# Patient Record
Sex: Female | Born: 1935 | Race: White | Hispanic: No | Marital: Married | Smoking: Never smoker
Health system: Southern US, Community
[De-identification: ages and names within clinical notes are randomized; demographics above are authoritative.]

## PROBLEM LIST (undated history)

## (undated) DIAGNOSIS — I480 Paroxysmal atrial fibrillation: Secondary | ICD-10-CM

## (undated) DIAGNOSIS — J449 Chronic obstructive pulmonary disease, unspecified: Secondary | ICD-10-CM

## (undated) DIAGNOSIS — I509 Heart failure, unspecified: Secondary | ICD-10-CM

## (undated) DIAGNOSIS — R55 Syncope and collapse: Secondary | ICD-10-CM

## (undated) DIAGNOSIS — I639 Cerebral infarction, unspecified: Secondary | ICD-10-CM

## (undated) DIAGNOSIS — G20 Parkinson's disease: Secondary | ICD-10-CM

## (undated) DIAGNOSIS — G20A1 Parkinson's disease without dyskinesia, without mention of fluctuations: Secondary | ICD-10-CM

## (undated) HISTORY — DX: Parkinson's disease: G20

## (undated) HISTORY — DX: Paroxysmal atrial fibrillation: I48.0

## (undated) HISTORY — DX: Parkinson's disease without dyskinesia, without mention of fluctuations: G20.A1

## (undated) HISTORY — PX: COLON SURGERY: SHX602

## (undated) HISTORY — PX: OOPHORECTOMY: SHX86

## (undated) HISTORY — DX: Cerebral infarction, unspecified: I63.9

## (undated) HISTORY — DX: Syncope and collapse: R55

## (undated) HISTORY — PX: CHOLECYSTECTOMY: SHX55

---

## 2007-07-14 ENCOUNTER — Encounter: Payer: Self-pay | Admitting: Cardiology

## 2009-08-08 ENCOUNTER — Encounter: Payer: Self-pay | Admitting: Cardiology

## 2009-08-29 ENCOUNTER — Encounter: Payer: Self-pay | Admitting: Cardiology

## 2009-10-07 ENCOUNTER — Ambulatory Visit: Payer: Self-pay | Admitting: Cardiology

## 2009-10-07 DIAGNOSIS — I4892 Unspecified atrial flutter: Secondary | ICD-10-CM | POA: Insufficient documentation

## 2009-10-07 DIAGNOSIS — I4891 Unspecified atrial fibrillation: Secondary | ICD-10-CM

## 2009-10-07 DIAGNOSIS — I6350 Cerebral infarction due to unspecified occlusion or stenosis of unspecified cerebral artery: Secondary | ICD-10-CM | POA: Insufficient documentation

## 2009-10-10 ENCOUNTER — Encounter (INDEPENDENT_AMBULATORY_CARE_PROVIDER_SITE_OTHER): Payer: Self-pay | Admitting: *Deleted

## 2009-10-14 ENCOUNTER — Ambulatory Visit: Payer: Self-pay | Admitting: Cardiology

## 2009-10-14 LAB — CONVERTED CEMR LAB: POC INR: 2.4

## 2009-10-16 ENCOUNTER — Encounter: Payer: Self-pay | Admitting: Cardiology

## 2009-10-20 ENCOUNTER — Encounter (INDEPENDENT_AMBULATORY_CARE_PROVIDER_SITE_OTHER): Payer: Self-pay | Admitting: *Deleted

## 2009-10-21 ENCOUNTER — Ambulatory Visit: Payer: Self-pay | Admitting: Cardiology

## 2009-10-28 ENCOUNTER — Ambulatory Visit: Payer: Self-pay | Admitting: Cardiology

## 2009-10-28 ENCOUNTER — Encounter: Payer: Self-pay | Admitting: Cardiology

## 2009-10-28 LAB — CONVERTED CEMR LAB: POC INR: 4

## 2009-11-01 ENCOUNTER — Ambulatory Visit: Payer: Self-pay | Admitting: Cardiology

## 2009-11-01 LAB — CONVERTED CEMR LAB: POC INR: 2.1

## 2009-11-29 ENCOUNTER — Ambulatory Visit: Payer: Self-pay | Admitting: Cardiology

## 2009-12-14 ENCOUNTER — Ambulatory Visit: Payer: Self-pay | Admitting: Cardiology

## 2009-12-14 ENCOUNTER — Encounter: Payer: Self-pay | Admitting: Cardiology

## 2009-12-14 ENCOUNTER — Encounter: Payer: Self-pay | Admitting: Physician Assistant

## 2009-12-14 ENCOUNTER — Encounter (INDEPENDENT_AMBULATORY_CARE_PROVIDER_SITE_OTHER): Payer: Self-pay | Admitting: *Deleted

## 2009-12-16 ENCOUNTER — Encounter: Payer: Self-pay | Admitting: Cardiology

## 2009-12-22 ENCOUNTER — Encounter: Payer: Self-pay | Admitting: Physician Assistant

## 2009-12-22 ENCOUNTER — Ambulatory Visit: Payer: Self-pay | Admitting: Cardiology

## 2009-12-27 ENCOUNTER — Encounter: Payer: Self-pay | Admitting: Cardiology

## 2009-12-27 ENCOUNTER — Ambulatory Visit: Payer: Self-pay | Admitting: Cardiology

## 2009-12-28 ENCOUNTER — Encounter (INDEPENDENT_AMBULATORY_CARE_PROVIDER_SITE_OTHER): Payer: Self-pay | Admitting: *Deleted

## 2010-05-20 ENCOUNTER — Encounter: Payer: Self-pay | Admitting: Cardiology

## 2010-05-21 ENCOUNTER — Encounter: Payer: Self-pay | Admitting: Physician Assistant

## 2010-05-24 ENCOUNTER — Encounter: Payer: Self-pay | Admitting: Physician Assistant

## 2010-05-25 ENCOUNTER — Encounter: Payer: Self-pay | Admitting: Physician Assistant

## 2010-06-20 ENCOUNTER — Ambulatory Visit: Admit: 2010-06-20 | Payer: Self-pay | Admitting: Physician Assistant

## 2010-06-20 NOTE — Assessment & Plan Note (Signed)
Summary: Beverely Pace ADD THIS DATE  Nurse Visit  CC: ekg only   Allergies: 1)  ! Codeine  Orders Added: 1)  EKG w/ Interpretation [93000] EKG relatively benign with sinus rhythm and first degree AV block otherwise normal tracing.

## 2010-06-20 NOTE — Letter (Signed)
Summary: Engineer, materials at [facility]  [address]   [place], [place] [address]   Phone: [phone]  Fax: [phone]        October 20, 2009 MRN: [id number]   Kaylee Drake [address] [place], [place]  [address]   Dear [name],  Your test ordered by Selena Batten has been reviewed by your physician (or physician assistant) and was found to be normal or stable. Your physician (or physician assistant) felt no changes were needed at this time.  __X__ Echocardiogram  ____ Cardiac Stress Test  ____ Lab Work  ____ Peripheral vascular study of arms, legs or neck  ____ CT scan or X-ray  ____ Lung or Breathing test  ____ Other:   Thank you.   Hoover Brunette, LPN    Duane Boston, M.D., F.A.C.C. Thressa Sheller, M.D., F.A.C.C. Oneal Grout, M.D., F.A.C.C. Cheree Ditto, M.D., F.A.C.C. Daiva Nakayama, M.D., F.A.C.C. Kenney Houseman, M.D., F.A.C.C. Jeanne Ivan, PA-C

## 2010-06-20 NOTE — Medication Information (Signed)
Summary: ccr-lr  Anticoagulant Therapy  Managed by: Vashti Hey, RN PCP: Carney Bern Rutherford Nail) Supervising MD: Andee Lineman MD, Michelle Piper Indication 1: Atrial Fibrillation Lab Used: [facility] [place] Site: [place] INR POC 1.9  Dietary changes: no    Health status changes: no    Bleeding/hemorrhagic complications: no    Recent/future hospitalizations: no    Any changes in medication regimen? no    Recent/future dental: no  Any missed doses?: no       Is patient compliant with meds? yes       Allergies: 1)  ! Codeine  Anticoagulation Management History:      The patient is taking warfarin and comes in today for a routine follow up visit.  Positive risk factors for bleeding include an age of 70 years or older and history of CVA/TIA.  The bleeding index is 'intermediate risk'.  Positive CHADS2 values include Age > 73 years old and Prior Stroke/CVA/TIA.  Anticoagulation responsible provider: Andee Lineman MD, Michelle Piper.  INR POC: 1.9.    Anticoagulation Management Assessment/Plan:      The patient's current anticoagulation dose is Coumadin 5 mg tabs: take as directed per coumadin clinic, Coumadin 5 mg tabs: take as directed per coumadin clini.  The target INR is 2.0-3.0.  The next INR is due 12/20/2009.  Anticoagulation instructions were given to patient.  Results were reviewed/authorized by Vashti Hey, RN.  She was notified by Vashti Hey RN.         Prior Anticoagulation Instructions: INR 2.1 Continue coumadin 5mg  once daily except 7.5mg  on Wednesdays Will have labs drawn week of 6/27 in [place]  She will return 11/21/09.   Next INR  11/29/09  Current Anticoagulation Instructions: INR 1.9 Increase coumadin to 5mg  once daily except 7.5mg  once daily except M,W,F

## 2010-06-20 NOTE — Procedures (Signed)
Summary: Holter and Event/ [facility]  Holter and Event/ [facility]   Imported By: Dorise Hiss 10/10/2009 13:59:47  _____________________________________________________________________  External Attachment:    Type:   Image     Comment:   External Document

## 2010-06-20 NOTE — Letter (Signed)
Summary: Graded Exercise Tolerance Test  [facility] [address]   [place], [place] [address]   Phone: [phone]  Fax: [phone]      [facility]  Graded Exercise Tolerance Test    Tawana Scale  Appointment Date:_  Appointment Time:_   Your doctor has ordered a stress test to help determine the condition of your  heart during exercise. If you take blood pressure medicine , ask your doctor if you should take it the day of your test. You may eat a light meal before your test.  Please be sure to bring the copy of your order with you.   You should dress comfortably, for example: Sweat pants, shorts, or skirt, Loose                                                                                                       short sleeved T-shirt, Rubber soled lace-up shoes (tennis shoes)  You will need to arrive 15 minutes before your appointment time. You will also need to enter at the Main Entrance of the hospital and go to the registration desk. They will direct you to the Cardiovascular Department on the third floor.  You will need to plan on being at the hospital for one hour from registration for this appointment.

## 2010-06-20 NOTE — Medication Information (Signed)
Summary: CCN- START 5/20  Anticoagulant Therapy  Managed by: Vashti Hey, RN PCP: Carney Bern Rutherford Nail) Supervising MD: Andee Lineman MD, Michelle Piper Indication 1: Atrial Fibrillation Lab Used: [facility] [place] Site: [place] INR POC 2.4  Dietary changes: no    Health status changes: yes       Details: break through atrial fib  Bleeding/hemorrhagic complications: no    Recent/future hospitalizations: no    Any changes in medication regimen? yes       Details: Started on coumadin 5mg  qd on 10/07/09  Recent/future dental: no  Any missed doses?: no       Is patient compliant with meds? yes       Allergies: 1)  ! Codeine  Anticoagulation Management History:      The patient is taking warfarin and comes in today for a routine follow up visit.  Positive risk factors for bleeding include an age of 9 years or older and history of CVA/TIA.  The bleeding index is 'intermediate risk'.  Positive CHADS2 values include Age > 83 years old and Prior Stroke/CVA/TIA.  Anticoagulation responsible provider: Andee Lineman MD, Michelle Piper.  INR POC: 2.4.    Anticoagulation Management Assessment/Plan:      The patient's current anticoagulation dose is Coumadin 5 mg tabs: take as directed per coumadin clinic, Coumadin 5 mg tabs: take as directed per coumadin clini.  The target INR is 2.0-3.0.  The next INR is due 10/21/2009.  Anticoagulation instructions were given to patient.  Results were reviewed/authorized by Vashti Hey, RN.  She was notified by Vashti Hey RN.        Coagulation management information includes: 10/14/09 Check INR's till theraputic then transfer to PMD.  Current Anticoagulation Instructions: INR 2.4 Continue coumadin 5mg  once daily

## 2010-06-20 NOTE — Medication Information (Signed)
Summary: ccr-lr  Anticoagulant Therapy  Managed by: Vashti Hey, RN PCP: Carney Bern Rutherford Nail) Supervising MD: Diona Browner MD, Remi Deter Indication 1: Atrial Fibrillation Lab Used: [facility] [place] Site: [place] INR POC 4.0  Dietary changes: no    Health status changes: no    Bleeding/hemorrhagic complications: no    Recent/future hospitalizations: no    Any changes in medication regimen? no    Recent/future dental: no  Any missed doses?: no       Is patient compliant with meds? yes       Allergies: 1)  ! Codeine  Anticoagulation Management History:      The patient is taking warfarin and comes in today for a routine follow up visit.  Positive risk factors for bleeding include an age of 75 years or older and history of CVA/TIA.  The bleeding index is 'intermediate risk'.  Positive CHADS2 values include Age > 75 years old and Prior Stroke/CVA/TIA.  Anticoagulation responsible provider: Diona Browner MD, Remi Deter.  INR POC: 4.0.  Cuvette Lot#: [id number].    Anticoagulation Management Assessment/Plan:      The patient's current anticoagulation dose is Coumadin 5 mg tabs: take as directed per coumadin clinic, Coumadin 5 mg tabs: take as directed per coumadin clini.  The target INR is 2.0-3.0.  The next INR is due 11/01/2009.  Anticoagulation instructions were given to patient.  Results were reviewed/authorized by Vashti Hey, RN.  She was notified by Vashti Hey RN.         Prior Anticoagulation Instructions: INR 1.6 Take coumadin 10mg  tonight, 7.5mg  tomorrow night then increase dose to 5mg  once daily except 7.5mg  on Wednesdays  Current Anticoagulation Instructions: INR 4.0 Hold coumadin tonight then resume 5mg  once daily except 7.5mg  on Wednesdays

## 2010-06-20 NOTE — Letter (Signed)
Summary: Internal Other/ PATIENT HISTORY FORM  Internal Other/ PATIENT HISTORY FORM   Imported By: Dorise Hiss 10/10/2009 13:57:26  _____________________________________________________________________  External Attachment:    Type:   Image     Comment:   External Document

## 2010-06-20 NOTE — Medication Information (Signed)
Summary: ccr-lr  Anticoagulant Therapy  Managed by: Vashti Hey, RN PCP: Carney Bern Rutherford Nail) Supervising MD: Andee Lineman MD, Michelle Piper Indication 1: Atrial Fibrillation Lab Used: [facility] [place] Site: [place] INR POC 1.6  Dietary changes: no    Health status changes: no    Bleeding/hemorrhagic complications: no    Recent/future hospitalizations: no    Any changes in medication regimen? no    Recent/future dental: no  Any missed doses?: yes     Details: missed 1 dose Wed night  Is patient compliant with meds? yes       Allergies: 1)  ! Codeine  Anticoagulation Management History:      The patient is taking warfarin and comes in today for a routine follow up visit.  Positive risk factors for bleeding include an age of 75 years or older and history of CVA/TIA.  The bleeding index is 'intermediate risk'.  Positive CHADS2 values include Age > 75 years old and Prior Stroke/CVA/TIA.  Anticoagulation responsible provider: Andee Lineman MD, Michelle Piper.  INR POC: 1.6.  Cuvette Lot#: [id number].    Anticoagulation Management Assessment/Plan:      The patient's current anticoagulation dose is Coumadin 5 mg tabs: take as directed per coumadin clinic, Coumadin 5 mg tabs: take as directed per coumadin clini.  The target INR is 2.0-3.0.  The next INR is due 10/28/2009.  Anticoagulation instructions were given to patient.  Results were reviewed/authorized by Vashti Hey, RN.  She was notified by Vashti Hey RN.         Prior Anticoagulation Instructions: INR 2.4 Continue coumadin 5mg  once daily   Current Anticoagulation Instructions: INR 1.6 Take coumadin 10mg  tonight, 7.5mg  tomorrow night then increase dose to 5mg  once daily except 7.5mg  on Wednesdays

## 2010-06-20 NOTE — Assessment & Plan Note (Signed)
Summary: NP-PALPS  REQUEST DEGENT   Visit Type:  Initial Consult Primary Provider:  Carney Bern Rutherford Nail)   History of Present Illness: the patient is a 77 year old female being evaluated for history of palpitations and chest tightness.  The patient reports prior history of afibrillation and several years ago was started on propafenone.  Interestingly however she is only on once a day dosing.  She had a prior cardiac catheterization several years ago which was within normal limits.  She does have significant risk factors however in a setting of intrafibrillation for stroke including female sex age 9 and a prior history of stroke.  Most recently she was seen in the emergency room in [place] because of a prolonged episode of intrafibrillation which lasted an hour and a half.  She stapes episodes are unpredictable and can occur at various times.typically however it lasted 20 to 25 minutes.  Her most recent episode was clearly much longer.  This was associated with some dizziness and shortness of breath but no definite chest pain.  Of note is also that the patient is only on Aggrenox but is not taking Coumadin.  He also had no assessment of her recent ejection fraction.  Current Medications (verified): 1)  Alprazolam 1 Mg Tabs (Alprazolam) .... Take 1 Tablet By Mouth Two Times A Day 2)  Vitamin B-12 2500 Mcg Subl (Cyanocobalamin) .... Take 1 Tablet By Mouth Once A Day 3)  Centrum Silver  Tabs (Multiple Vitamins-Minerals) .... Take 1 Tablet By Mouth Once A Day 4)  Vitamin C 1000 Mg Tabs (Ascorbic Acid) .... Take 1 Tablet By Mouth Once A Day 5)  Calcium-Magnesium-Zinc 333-133-8.3 Mg Tabs (Calcium-Magnesium-Zinc) .... Take 1 Tablet By Mouth Once A Day 6)  Lexapro 20 Mg Tabs (Escitalopram Oxalate) .... Take 1 Tablet By Mouth Once A Day 7)  Propafenone Hcl 150 Mg Tabs (Propafenone Hcl) .... Take One Tab Every 8 Hours 8)  Diltiazem Hcl Er Beads 240 Mg Xr24h-Cap (Diltiazem Hcl Er Beads) ....  Take 1 Tablet By Mouth Once A Day 9)  Omeprazole 20 Mg Cpdr (Omeprazole) .... Take 1 Tablet By Mouth Two Times A Day 10)  Azilect 1 Mg Tabs (Rasagiline Mesylate) .... Take 1 Tablet By Mouth Once A Day 11)  Cetirizine Hcl 10 Mg Tabs (Cetirizine Hcl) .... Take 1 Tablet By Mouth Once A Day 12)  Eql Coq10 Max St 400 Mg Caps (Coenzyme Q10) .... Take 1 Tablet By Mouth Once A Day 13)  Folic Acid 1 Mg Tabs (Folic Acid) .... Take 1 Tablet By Mouth Once A Day 14)  Black Cohosh 540 Mg Caps (Black Cohosh) .... Take 1 Tablet By Mouth Once A Day 15)  Estroven  Tabs (Nutritional Supplements) .... Take 1 Tablet By Mouth Once A Day 16)  Lidoderm 5 % Ptch (Lidocaine) .... Apply Patch On For  12 Hours and Off 12 Hours 17)  Pravastatin Sodium 20 Mg Tabs (Pravastatin Sodium) .... Take 1 Tablet By Mouth Once A Day 18)  Trimethoprim 100 Mg Tabs (Trimethoprim) .... Take 1 Tablet By Mouth Once A Day 19)  Coumadin 5 Mg Tabs (Warfarin Sodium) .... Take As Directed Per Coumadin Clinic 20)  Coumadin 5 Mg Tabs (Warfarin Sodium) .... Take As Directed Per Coumadin Clini  Allergies (verified): 1)  ! Codeine  Comments:  Nurse/Medical Assistant: The patient's medications and allergies were reviewed with the patient and were updated in the Medication and Allergy Lists. List reviewed.  Past History:  Past Medical History: history  of atrialfibrillation history of stroke history of colon surgery as well as gallbladder surgery and oophorectomy.  Family History: mother died from a stroke.  Father died in a hit and run accident.  Social History: the patient is retired and married.  She used to do the data entry.  Chest the children are in good health.  She does not smoke.  Review of Systems       The patient complains of palpitations.  The patient denies fatigue, malaise, fever, weight gain/loss, vision loss, decreased hearing, hoarseness, chest pain, shortness of breath, prolonged cough, wheezing, sleep apnea, coughing  up blood, abdominal pain, blood in stool, nausea, vomiting, diarrhea, heartburn, incontinence, blood in urine, muscle weakness, joint pain, leg swelling, rash, skin lesions, headache, fainting, dizziness, depression, anxiety, enlarged lymph nodes, easy bruising or bleeding, and environmental allergies.    Vital Signs:  Patient profile:   77 year old female Height:      64 inches Weight:      162 pounds BMI:     27.91 Pulse rate:   75 / minute BP sitting:   111 / 69  (left arm) Cuff size:   regular  Vitals Entered By: Carlye Grippe (Oct 07, 2009 9:13 AM)  Nutrition Counseling: Patient's BMI is greater than 25 and therefore counseled on weight management options.  Physical Exam  Additional Exam:  General: Well-developed, well-nourished in no distress head: Normocephalic and atraumatic eyes PERRLA/EOMI intact, conjunctiva and lids normal nose: No deformity or lesions mouth normal dentition, normal posterior pharynx neck: Supple, no JVD.  No masses, thyromegaly or abnormal cervical nodes lungs: Normal breath sounds bilaterally without wheezing.  Normal percussion heart: regular rate and rhythm with normal S1 and S2, no S3 or S4.  PMI is normal.  No pathological murmurs abdomen: Normal bowel sounds, abdomen is soft and nontender without masses, organomegaly or hernias noted.  No hepatosplenomegaly musculoskeletal: Back normal, normal gait muscle strength and tone normal pulsus: Pulse is normal in all 4 extremities Extremities: No peripheral pitting edema neurologic: Alert and oriented x 3 skin: Intact without lesions or rashes cervical nodes: No significant adenopathy psychologic: Normal affect    Impression & Recommendations:  Problem # 1:  ATRIAL FIBRILLATION (ICD-427.31) the patient had breakthrough atrial fibrillation.  She is on adequate dosing with propafenone.  This will be increased to hundred 50 mg p.o. q.8 hours.  The patient has no known coronary artery disease.  We  will obtain a 2-D echocardiogram however to [place] reassess her ejection fraction The following medications were removed from the medication list:    Aggrenox 25-200 Mg Xr12h-cap (Aspirin-dipyridamole) .Marland Kitchen... Take 1 tablet by mouth two times a day Her updated medication list for this problem includes:    Propafenone Hcl 150 Mg Tabs (Propafenone hcl) .Marland Kitchen... Take one tab every 8 hours    Coumadin 5 Mg Tabs (Warfarin sodium) .Marland Kitchen... Take as directed per coumadin clinic    Coumadin 5 Mg Tabs (Warfarin sodium) .Marland Kitchen... Take as directed per coumadin clini  Orders: T-Basic Metabolic Panel [phone]) T-TSH [phone]) 2-D Echocardiogram (2D Echo)  Problem # 2:  COUMADIN THERAPY (ICD-V58.61) the patient is at high risk for thromboembolic disease.  She will be started on Coumadin.  We will initially enroll her in the Coumadin clinic until she is therapeutic and then she can follow-up with her primary care physician for PT and evaluation for monthly basis.  Problem # 3:  CVA (ICD-434.91) no residual.  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 The following medications were removed from the medication list:    Aggrenox 25-200 Mg Xr12h-cap (Aspirin-dipyridamole) .Marland Kitchen... Take 1 tablet by mouth two times a day Her updated medication list for this problem includes:    Coumadin 5 Mg Tabs (Warfarin sodium) .Marland Kitchen... Take as directed per coumadin clinic    Coumadin 5 Mg Tabs (Warfarin sodium) .Marland Kitchen... Take as directed per coumadin clini  Other Orders: EKG w/ Interpretation (93000)  Patient Instructions: 1)  Begin Coumadin 5mg  every evening 2)  Enroll in coumadin clinic - will check here till therapeutic, then will transfer to PMD for management 3)  Stop Aggrenox 4)  Increase Propafenone to 150mg  every 8 hours 5)  Follow up in  1 month Prescriptions: PROPAFENONE HCL 150 MG TABS (PROPAFENONE HCL) take one tab every 8 hours  #270 x 3   Entered by:   Hoover Brunette, LPN   Authorized by:   Lewayne Bunting, MD, [facility]   Signed by:   Hoover Brunette, LPN on 54/01/8118   Method used:   Faxed to ...       [facility])             , [place]         Ph: [phone]       Fax: [phone]   RxID:   [id number] COUMADIN 5 MG TABS (WARFARIN SODIUM) take as directed per coumadin clini  #30 x 2   Entered by:   Hoover Brunette, LPN   Authorized by:   Lewayne Bunting, MD, [facility]   Signed by:   Hoover Brunette, LPN on 72/53/6644   Method used:   Print then Give to Patient   RxID:   [id number]   Handout requested. COUMADIN 5 MG TABS (WARFARIN SODIUM) take as directed per coumadin clinic  #30 x 2   Entered by:   Hoover Brunette, LPN   Authorized by:   Lewayne Bunting, MD, [facility]   Signed by:   Hoover Brunette, LPN on 33/29/5188   Method used:   Print then Give to Patient   RxID:   [id number]   Handout requested.

## 2010-06-20 NOTE — Medication Information (Signed)
Summary: ccr-lr  Anticoagulant Therapy  Managed by: Vashti Hey, RN PCP: Carney Bern Rutherford Nail) Supervising MD: Antoine Poche MD, Fayrene Fearing Indication 1: Atrial Fibrillation Lab Used: [facility] [place] Site: [place] INR POC 1.7  Dietary changes: no    Health status changes: no    Bleeding/hemorrhagic complications: no    Recent/future hospitalizations: no    Any changes in medication regimen? no    Recent/future dental: no  Any missed doses?: no       Is patient compliant with meds? yes       Allergies: 1)  ! Codeine  Anticoagulation Management History:      The patient is taking warfarin and comes in today for a routine follow up visit.  Positive risk factors for bleeding include an age of 5 years or older and history of CVA/TIA.  The bleeding index is 'intermediate risk'.  Positive CHADS2 values include Age > 47 years old and Prior Stroke/CVA/TIA.  Her last INR was 2.6.  Anticoagulation responsible provider: Antoine Poche MD, Fayrene Fearing.  INR POC: 1.7.  Cuvette Lot#: [id number].    Anticoagulation Management Assessment/Plan:      The patient's current anticoagulation dose is Coumadin 5 mg tabs: take as directed per coumadin clinic.  The target INR is 2.0-3.0.  The next INR is due 12/30/2009.  Anticoagulation instructions were given to patient.  Results were reviewed/authorized by Vashti Hey, RN.  She was notified by Vashti Hey RN.         Prior Anticoagulation Instructions: INR 2.6 Continue coumadin 5mg  once daily except 7.5mg  on M,W,F [place] recheck INR on 12/30/09 then refer to PMD for management Pt told  Current Anticoagulation Instructions: INR 1.7 Pt missed dose last night so she took it early this morning (7.5mg ) She will resume her current dose of 5mg  once daily except 7.5mg  on Mondays, Wednesdays and Fridays Recheck INR in 10 - 14 days in [place]

## 2010-06-20 NOTE — Procedures (Signed)
Summary: Holter and Event  Holter and Event   Imported By: Claudette Laws 08/26/2009 08:18:10  _____________________________________________________________________  External Attachment:    Type:   Image     Comment:   External Document

## 2010-06-20 NOTE — Medication Information (Signed)
Summary: ccr-lr  Anticoagulant Therapy  Managed by: Vashti Hey, RN PCP: Carney Bern Rutherford Nail) Supervising MD: Andee Lineman MD, Michelle Piper Indication 1: Atrial Fibrillation Lab Used: [facility] [place] Site: [place] INR POC 2.1  Dietary changes: no    Health status changes: no    Bleeding/hemorrhagic complications: no    Recent/future hospitalizations: no    Any changes in medication regimen? no    Recent/future dental: no  Any missed doses?: no       Is patient compliant with meds? yes       Allergies: 1)  ! Codeine  Anticoagulation Management History:      The patient is taking warfarin and comes in today for a routine follow up visit.  Positive risk factors for bleeding include an age of 48 years or older and history of CVA/TIA.  The bleeding index is 'intermediate risk'.  Positive CHADS2 values include Age > 82 years old and Prior Stroke/CVA/TIA.  Anticoagulation responsible provider: Andee Lineman MD, Michelle Piper.  INR POC: 2.1.  Cuvette Lot#: [id number].    Anticoagulation Management Assessment/Plan:      The patient's current anticoagulation dose is Coumadin 5 mg tabs: take as directed per coumadin clinic, Coumadin 5 mg tabs: take as directed per coumadin clini.  The target INR is 2.0-3.0.  The next INR is due 11/29/2009.  Anticoagulation instructions were given to patient.  Results were reviewed/authorized by Vashti Hey, RN.  She was notified by Vashti Hey RN.         Prior Anticoagulation Instructions: INR 4.0 Hold coumadin tonight then resume 5mg  once daily except 7.5mg  on Wednesdays  Current Anticoagulation Instructions: INR 2.1 Continue coumadin 5mg  once daily except 7.5mg  on Wednesdays Will have labs drawn week of 6/27 in [place]  She will return 11/21/09.   Next INR  11/29/09

## 2010-06-20 NOTE — Medication Information (Signed)
Summary: Coumadin Clinic  Anticoagulant Therapy  Managed by: Vashti Hey, RN PCP: Carney Bern [facility] Nail) Supervising MD: Diona Browner MD, Remi Deter Indication 1: Atrial Fibrillation Lab Used: [facility] [place] Site: [place] INR POC 2.6  Dietary changes: no    Health status changes: no    Bleeding/hemorrhagic complications: no    Recent/future hospitalizations: no    Any changes in medication regimen? no    Recent/future dental: no  Any missed doses?: no       Is patient compliant with meds? yes       Allergies: 1)  ! Codeine  Anticoagulation Management History:      The patient is taking warfarin and comes in today for a routine follow up visit.  Positive risk factors for bleeding include an age of 75 years or older and history of CVA/TIA.  The bleeding index is 'intermediate risk'.  Positive CHADS2 values include Age > 3 years old and Prior Stroke/CVA/TIA.  Her last INR was 2.6.  Anticoagulation responsible provider: Diona Browner MD, Remi Deter.  INR POC: 2.6.  Cuvette Lot#: [id number].    Anticoagulation Management Assessment/Plan:      The patient's current anticoagulation dose is Coumadin 5 mg tabs: take as directed per coumadin clinic.  The target INR is 2.0-3.0.  The next INR is due 12/30/2009.  Anticoagulation instructions were given to patient.  Results were reviewed/authorized by Vashti Hey, RN.  She was notified by Vashti Hey RN.         Prior Anticoagulation Instructions: INR 1.9 Increase coumadin to 5mg  once daily except 7.5mg  once daily except M,W,F  Current Anticoagulation Instructions: INR 2.6 Continue coumadin 5mg  once daily except 7.5mg  on M,W,F Wiil recheck INR on 12/30/09 then refer to PMD for management Pt told

## 2010-06-20 NOTE — Letter (Signed)
Summary: Engineer, materials at [facility]  [address]   [place], [place] [address]   Phone: [phone]  Fax: [phone]        December 28, 2009 MRN: [id number]   Kaylee Drake [address] [place], [place]  [address]   Dear Kaylee Drake,  Your test ordered by Selena Batten has been reviewed by your physician (or physician assistant) and was found to be normal or stable. Your physician (or physician assistant) felt no changes were needed at this time.  ____ Echocardiogram  __X__ Cardiac Stress Test  __X_ Lab Work  ____ Peripheral vascular study of arms, legs or neck  ____ CT scan or X-ray  ____ Lung or Breathing test  ____ Other:   Thank you.   Hoover Brunette, LPN    Duane Boston, M.D., F.A.C.C. Thressa Sheller, M.D., F.A.C.C. Oneal Grout, M.D., F.A.C.C. Cheree Ditto, M.D., F.A.C.C. Daiva Nakayama, M.D., F.A.C.C. Kenney Houseman, M.D., F.A.C.C. Jeanne Ivan, PA-C

## 2010-06-20 NOTE — Medication Information (Signed)
Summary: RX Folder/ PATIENT ORDER FORM  RX Folder/ PATIENT ORDER FORM   Imported By: Dorise Hiss 10/28/2009 15:11:52  _____________________________________________________________________  External Attachment:    Type:   Image     Comment:   External Document

## 2010-06-20 NOTE — Letter (Signed)
Summary: Engineer, materials at [facility]  [address]   [place], [place] [address]   Phone: [phone]  Fax: [phone]        Oct 10, 2009 MRN: [id number]   EMILYGRACE GROTHE [address] [place], [place]  [address]   Dear [name],  Your test ordered by Selena Batten has been reviewed by your physician (or physician assistant) and was found to be normal or stable. Your physician (or physician assistant) felt no changes were needed at this time.  ____ Echocardiogram  ____ Cardiac Stress Test  __X__ Lab Work  ____ Peripheral vascular study of arms, legs or neck  ____ CT scan or X-ray  ____ Lung or Breathing test  ____ Other:   Thank you.   Hoover Brunette, LPN    Duane Boston, M.D., F.A.C.C. Thressa Sheller, M.D., F.A.C.C. Oneal Grout, M.D., F.A.C.C. Cheree Ditto, M.D., F.A.C.C. Daiva Nakayama, M.D., F.A.C.C. Kenney Houseman, M.D., F.A.C.C. Jeanne Ivan, PA-C

## 2010-06-20 NOTE — Medication Information (Signed)
Summary: RX Folder/ PROPAFENONE HCL APPROVED ESI  RX Folder/ PROPAFENONE HCL APPROVED ESI   Imported By: Dorise Hiss 10/20/2009 14:00:17  _____________________________________________________________________  External Attachment:    Type:   Image     Comment:   External Document

## 2010-06-20 NOTE — Assessment & Plan Note (Signed)
Summary: 1 MO FU   Visit Type:  Follow-up Primary Provider:  Carney Bern [facility])   History of Present Illness: patient presents for scheduled followup.   When last seen, she was placed on an increased dose of propafenone for more aggressive treatment of frequent, breakthrough PAF. She reports today that she has not had any recurrent palpitations. A 2-D echo was also ordered, for assessment of LVF:  EF 60-65%, with normal wall motion, and with no significant valvular abnormalities.  She denies any interim development of exertional angina pectoris or significant dyspnea. Of note, she does suggest gait instability, but was diagnosed with Parkinson's disease approximately 18 months ago. She is now on Coumadin anticoagulation, initiated at time of last visit here, per Dr. Andee Lineman, and is currently being monitored in our clinic.  Anticoagulation Management History:      Today's INR is 2.6.    Preventive Screening-Counseling & Management  Alcohol-Tobacco     Smoking Status: never  Current Medications (verified): 1)  Alprazolam 1 Mg Tabs (Alprazolam) .... Take 1 Tablet By Mouth Two Times A Day 2)  Vitamin B-12 2500 Mcg Subl (Cyanocobalamin) .... Take 1 Tablet By Mouth Once A Day 3)  Centrum Silver  Tabs (Multiple Vitamins-Minerals) .... Take 1 Tablet By Mouth Once A Day 4)  Vitamin C 1000 Mg Tabs (Ascorbic Acid) .... Take 1 Tablet By Mouth Once A Day 5)  Calcium-Magnesium-Zinc 333-133-8.3 Mg Tabs (Calcium-Magnesium-Zinc) .... Take 1 Tablet By Mouth Once A Day 6)  Lexapro 20 Mg Tabs (Escitalopram Oxalate) .... Take 1 Tablet By Mouth Once A Day 7)  Propafenone Hcl 150 Mg Tabs (Propafenone Hcl) .... Take One Tab Every 8 Hours 8)  Diltiazem Hcl Er Beads 240 Mg Xr24h-Cap (Diltiazem Hcl Er Beads) .... Take 1 Tablet By Mouth Once A Day 9)  Omeprazole 20 Mg Cpdr (Omeprazole) .... Take 1 Tablet By Mouth Two Times A Day 10)  Azilect 1 Mg Tabs (Rasagiline Mesylate) .... Take 1 Tablet By Mouth Once  A Day 11)  Cetirizine Hcl 10 Mg Tabs (Cetirizine Hcl) .... Take 1 Tablet By Mouth Once A Day 12)  Eql Coq10 Max St 400 Mg Caps (Coenzyme Q10) .... Take 1 Tablet By Mouth Once A Day 13)  Folic Acid 1 Mg Tabs (Folic Acid) .... Take 1 Tablet By Mouth Once A Day 14)  Black Cohosh 540 Mg Caps (Black Cohosh) .... Take 1 Tablet By Mouth Once A Day 15)  Estroven  Tabs (Nutritional Supplements) .... Take 1 Tablet By Mouth Once A Day 16)  Lidoderm 5 % Ptch (Lidocaine) .... Apply Patch On For  12 Hours and Off 12 Hours 17)  Pravastatin Sodium 20 Mg Tabs (Pravastatin Sodium) .... Take 1 Tablet By Mouth Once A Day 18)  Trimethoprim 100 Mg Tabs (Trimethoprim) .... Take 1 Tablet By Mouth Once A Day 19)  Coumadin 5 Mg Tabs (Warfarin Sodium) .... Take As Directed Per Coumadin Clinic  Allergies (verified): 1)  ! Codeine  Comments:  Nurse/Medical Assistant: The patient's medication list and allergies were reviewed with the patient and were updated in the Medication and Allergy Lists.  Past History:  Past Medical History: Paroxysmal atrial fibrillation Stroke Parkinson's disease  Past Surgical History: Colon surgery Cholecystectomy Oophorectomy  Social History: Smoking Status:  never  Review of Systems       No fevers, chills, hemoptysis, dysphagia, melena, hematocheezia, hematuria, rash, claudication, orthopnea, pnd, pedal edema. Gait instability with history of falls, but none since last office visit.  No syncope. All other systems negative.   Vital Signs:  Patient profile:   77 year old female Height:      64 inches Weight:      163 pounds Pulse rate:   75 / minute BP sitting:   108 / 68  (left arm) Cuff size:   regular  Vitals Entered By: Carlye Grippe (December 14, 2009 2:11 PM)  Physical Exam  Additional Exam:  GEN:77 year old female, sitting upright, no distress HEENT: NCAT,PERRLA,EOMI NECK: palpable pulses, no bruits; no JVD; no TM LUNGS: CTA bilaterally HEART: RRR (S1S2); no  significant murmurs; no rubs; no gallops ABD: soft, NT; intact BS EXT: intact distal pulses; no edema SKIN: warm, dry MUSC: no obvious deformity NEURO: A/O (x3)     EKG  Procedure date:  12/14/2009  Findings:      first degree AV block at 72 bpm; new RBBB; QT interval of 0.4 seconds, per our calculation (512 ms, per computer)  Impression & Recommendations:  Problem # 1:  ATRIAL FIBRILLATION (ICD-427.31)  maintaining normal sinus rhythm/first-degree AV block on increased dose of propafenone. However, she has developed new RBBB. QT Interval also appears more prolonged than on previous studies. Therefore, we'll decrease her propafenone to b.i.d. dosing, and schedule a routine treadmill test next week, to rule out proarrhythmia effect. We'll also check propafenone drug level. Return clinic visit with Dr. Andee Lineman in 4 months. Of note, with respect to her recent 2-D echo result (normal LVEF), patient asked about the previously noted "hole in heart". No such condition was noted on current study. We'll request echocardiogram result from her previous cardiologist, Dr. Gasper Sells, in [place], for review.  Problem # 2:  COUMADIN THERAPY (ICD-V58.61)  recommend subsequent monitoring and management, per primary care team in [place]. INR 2.6 in our clinic today.  Problem # 3:  CVA (ICD-434.91) Assessment: Comment Only  Other Orders: EKG w/ Interpretation (93[address]) Protime INR ([address]) One Laboratory Results   Blood Tests   Date/Time Recieved: December 14, 2009 2:14 PM  Date/Time Reported: December 14, 2009 2:14 PM    INR: 2.6   (Normal Range: 0.88-1.12   Therap INR: 2.0-3.5)       Anticoagulant Therapy  Managed by: Vashti Hey, RN PCP: Carney Bern Rutherford Nail) Supervising MD: Andee Lineman MD, Michelle Piper Indication 1: Atrial Fibrillation Lab Used: [facility] [place] Site: [place] INR POC 2.6  Vital Signs: Weight: 163 lbs.  Pulse Rate: 75  Blood Pressure:  108 / 68     Dietary changes: no    Health status changes: no    Bleeding/hemorrhagic complications: no    Recent/future hospitalizations: no    Any changes in medication regimen? no    Recent/future dental: no  Any missed doses?: no       Is patient compliant with meds? yes       Appended Document: [facility]     Current Medications (verified): 1)  Alprazolam 1 Mg Tabs (Alprazolam) .... Take 1 Tablet By Mouth Two Times A Day 2)  Vitamin B-12 2500 Mcg Subl (Cyanocobalamin) .... Take 1 Tablet By Mouth Once A Day 3)  Centrum Silver  Tabs (Multiple Vitamins-Minerals) .... Take 1 Tablet By Mouth Once A Day 4)  Vitamin C 1000 Mg Tabs (Ascorbic Acid) .... Take 1 Tablet By Mouth Once A Day 5)  Calcium-Magnesium-Zinc 333-133-8.3 Mg Tabs (Calcium-Magnesium-Zinc) .... Take 1 Tablet By Mouth Once A Day 6)  Lexapro 20 Mg Tabs (Escitalopram Oxalate) .... Take  1 Tablet By Mouth Once A Day 7)  Propafenone Hcl 150 Mg Tabs (Propafenone Hcl) .... Take 1 Tablet By Mouth Two Times A Day 8)  Diltiazem Hcl Er Beads 240 Mg Xr24h-Cap (Diltiazem Hcl Er Beads) .... Take 1 Tablet By Mouth Once A Day 9)  Omeprazole 20 Mg Cpdr (Omeprazole) .... Take 1 Tablet By Mouth Two Times A Day 10)  Azilect 1 Mg Tabs (Rasagiline Mesylate) .... Take 1 Tablet By Mouth Once A Day 11)  Cetirizine Hcl 10 Mg Tabs (Cetirizine Hcl) .... Take 1 Tablet By Mouth Once A Day 12)  Eql Coq10 Max St 400 Mg Caps (Coenzyme Q10) .... Take 1 Tablet By Mouth Once A Day 13)  Folic Acid 1 Mg Tabs (Folic Acid) .... Take 1 Tablet By Mouth Once A Day 14)  Black Cohosh 540 Mg Caps (Black Cohosh) .... Take 1 Tablet By Mouth Once A Day 15)  Estroven  Tabs (Nutritional Supplements) .... Take 1 Tablet By Mouth Once A Day 16)  Lidoderm 5 % Ptch (Lidocaine) .... Apply Patch On For  12 Hours and Off 12 Hours 17)  Pravastatin Sodium 20 Mg Tabs (Pravastatin Sodium) .... Take 1 Tablet By Mouth Once A Day 18)  Trimethoprim 100 Mg Tabs (Trimethoprim) ....  Take 1 Tablet By Mouth Once A Day 19)  Coumadin 5 Mg Tabs (Warfarin Sodium) .... Take As Directed Per Coumadin Clinic  Allergies: 1)  ! Codeine   Other Orders: T- * Misc. Laboratory test [phone]) GXT (GXT)  Patient Instructions: 1)  Decrease Propafenone to 150mg  two times a day  2)  Routine Treadmill 3)  Lab:   Propafenone level 4)  Follow up coumadin with primary MD 5)  Will request recent Echo result from Dr. Farris Has  6)  Follow up in  4 months

## 2010-06-20 NOTE — Medication Information (Signed)
Summary: Coumadin Clinic  Anticoagulant Therapy  Managed by: Inactive PCP: Carney Bern Rutherford Nail) Supervising MD: Antoine Poche MD, Fayrene Fearing Indication 1: Atrial Fibrillation Lab Used: [facility] [place] Site: [place]          Comments: Pt is transfering coumadin management to [employer] [facility] in [place]  Allergies: 1)  ! Codeine  Anticoagulation Management History:      Positive risk factors for bleeding include an age of 75 years or older and history of CVA/TIA.  The bleeding index is 'intermediate risk'.  Positive CHADS2 values include Age > 75 years old and Prior Stroke/CVA/TIA.  Her last INR was 2.6.  Anticoagulation responsible provider: Antoine Poche MD, Fayrene Fearing.    Anticoagulation Management Assessment/Plan:      The patient's current anticoagulation dose is Coumadin 5 mg tabs: take as directed per coumadin clinic.  The target INR is 2.0-3.0.  The next INR is due 12/30/2009.  Anticoagulation instructions were given to patient.  Results were reviewed/authorized by Inactive.         Prior Anticoagulation Instructions: INR 1.7 Pt missed dose last night so she took it early this morning (7.5mg ) She will resume her current dose of 5mg  once daily except 7.5mg  on Mondays, Wednesdays and Fridays Recheck INR in 10 - 14 days in [place]

## 2010-06-22 NOTE — Miscellaneous (Signed)
Summary: Orders Update  Clinical Lists Changes  Orders: Added new Referral order of [facility]/Event Monitor ([facility]/Event) - Signed 

## 2010-06-28 ENCOUNTER — Telehealth (INDEPENDENT_AMBULATORY_CARE_PROVIDER_SITE_OTHER): Payer: Self-pay | Admitting: *Deleted

## 2010-06-28 NOTE — Consult Note (Signed)
Summary: Consultation Report/ DR. Ninetta Lights  Consultation Report/ DR. Ninetta Lights   Imported By: Dorise Hiss 06/19/2010 16:53:53  _____________________________________________________________________  External Attachment:    Type:   Image     Comment:   External Document

## 2010-06-28 NOTE — Letter (Signed)
Summary: [facility] D/C DR. Wilburt Finlay  [facility] D/C DR. Wilburt Finlay   Imported By: Zachary George 06/20/2010 09:48:36  _____________________________________________________________________  External Attachment:    Type:   Image     Comment:   External Document

## 2010-06-28 NOTE — Medication Information (Signed)
Summary: [facility] D/C MEDICATION SEET  [facility] D/C MEDICATION SEET   Imported By: Zachary George 06/20/2010 09:38:25  _____________________________________________________________________  External Attachment:    Type:   Image     Comment:   External Document

## 2010-06-28 NOTE — Consult Note (Signed)
Summary: CARDIOLOGY CONSULT/ [facility]  CARDIOLOGY CONSULT/ [facility]   Imported By: Zachary George 06/19/2010 16:21:09  _____________________________________________________________________  External Attachment:    Type:   Image     Comment:   External Document

## 2010-07-27 ENCOUNTER — Encounter (INDEPENDENT_AMBULATORY_CARE_PROVIDER_SITE_OTHER): Payer: Self-pay | Admitting: *Deleted

## 2010-08-01 NOTE — Progress Notes (Signed)
Summary: Monitor  Phone Note Other Incoming Call back at [phone]   Caller: [employer] Summary of Call: These are the previous communications with [employer] regarding monitor per order: Ordered. Cyril Loosen, RN, BSN  May 24, 2010 3:58 PM Call from [employer] that there is a delay pt starting monitor b/c they are waiting for pt to return their call. Cyril Loosen, RN, BSN  June 09, 2010 3:54 PM Pt is in rehab. [facility] needs permission to s/w husband. Notified pt signed designated release to s/w husband at previous OV. She will contact husband to find out which rehab pt is in. She will then contact rehab to see if pt can wear monitor there. Cyril Loosen, RN, BSN  June 16, 2010 2:54 PM  Spoke with representative from [facility] today who states monitor is at pt's home. Pt is in rehab center and will be for at least 5 more days. She states there is a note in the system that states pt's husband should not bring monitor to rehab center. She is not sure why this note is in the system. She states pt will be in rehab for at least 5 more days. She would like to know if monitor should be held for d/c or if it should be returned and order sent at later date if pt still needs monitor. Notified that monitor was ordered per hosp d/c. Pt was no show for hosp f/u on 1/31. Left message for husband to call back on voicemail to discuss these issues and find out the plan for monitor/follow up in our office.  Initial call taken by: Cyril Loosen, RN, BSN,  June 28, 2010 3:29 PM  Follow-up for Phone Call        Left message for husband to call back on voicemail. Cyril Loosen, RN, BSN  July 03, 2010 10:55 AM  Left message to call back on voicemail. Cyril Loosen, RN, BSN  July 14, 2010 2:54 PM Pt's husband never returned called. Letter mailed to pt/husband.  Follow-up by: Cyril Loosen, RN, BSN,  July 27, 2010 1:37 PM

## 2010-08-01 NOTE — Letter (Signed)
Summary: Generic Engineer, agricultural at [facility] [address]   [place], [place] [address]   Phone: [phone]  Fax: [phone]        July 27, 2010 MRN: [id number]    [facility] IN CARE OF MR. ENCALADA [address] [place], [place]  [address]    Dear Mr and Ms. EMILE,    We have attempted to reach you several times regarding the heart monitor that was ordered following Ms. Fuhrer's hospitalization. It also appears she has not pending appointments in our office.  Please contact our office to discuss these things.      Sincerely,  Cyril Loosen, RN, BSN  This letter has been electronically signed by your physician.

## 2010-09-07 HISTORY — PX: PACEMAKER IMPLANT: EP1218

## 2011-12-13 ENCOUNTER — Encounter: Payer: Self-pay | Admitting: Physician Assistant

## 2015-05-25 DIAGNOSIS — E782 Mixed hyperlipidemia: Secondary | ICD-10-CM | POA: Diagnosis not present

## 2015-05-25 DIAGNOSIS — I209 Angina pectoris, unspecified: Secondary | ICD-10-CM | POA: Diagnosis not present

## 2015-05-25 DIAGNOSIS — I340 Nonrheumatic mitral (valve) insufficiency: Secondary | ICD-10-CM | POA: Diagnosis not present

## 2015-05-25 DIAGNOSIS — I351 Nonrheumatic aortic (valve) insufficiency: Secondary | ICD-10-CM | POA: Diagnosis not present

## 2015-05-25 DIAGNOSIS — K449 Diaphragmatic hernia without obstruction or gangrene: Secondary | ICD-10-CM | POA: Diagnosis not present

## 2015-05-25 DIAGNOSIS — I5032 Chronic diastolic (congestive) heart failure: Secondary | ICD-10-CM | POA: Diagnosis not present

## 2015-05-25 DIAGNOSIS — R0602 Shortness of breath: Secondary | ICD-10-CM | POA: Diagnosis not present

## 2015-05-25 DIAGNOSIS — I639 Cerebral infarction, unspecified: Secondary | ICD-10-CM | POA: Diagnosis not present

## 2015-05-25 DIAGNOSIS — I071 Rheumatic tricuspid insufficiency: Secondary | ICD-10-CM | POA: Diagnosis not present

## 2015-05-25 DIAGNOSIS — I272 Other secondary pulmonary hypertension: Secondary | ICD-10-CM | POA: Diagnosis not present

## 2015-05-25 DIAGNOSIS — I10 Essential (primary) hypertension: Secondary | ICD-10-CM | POA: Diagnosis not present

## 2015-05-25 DIAGNOSIS — R002 Palpitations: Secondary | ICD-10-CM | POA: Diagnosis not present

## 2015-06-10 DIAGNOSIS — G20 Parkinson's disease: Secondary | ICD-10-CM | POA: Diagnosis not present

## 2015-06-10 DIAGNOSIS — I5022 Chronic systolic (congestive) heart failure: Secondary | ICD-10-CM | POA: Diagnosis not present

## 2015-06-10 DIAGNOSIS — I10 Essential (primary) hypertension: Secondary | ICD-10-CM | POA: Diagnosis not present

## 2015-06-10 DIAGNOSIS — E669 Obesity, unspecified: Secondary | ICD-10-CM | POA: Diagnosis not present

## 2015-06-10 DIAGNOSIS — Z6831 Body mass index (BMI) 31.0-31.9, adult: Secondary | ICD-10-CM | POA: Diagnosis not present

## 2015-06-10 DIAGNOSIS — D649 Anemia, unspecified: Secondary | ICD-10-CM | POA: Diagnosis not present

## 2015-06-10 DIAGNOSIS — Z8679 Personal history of other diseases of the circulatory system: Secondary | ICD-10-CM | POA: Diagnosis not present

## 2015-06-10 DIAGNOSIS — F419 Anxiety disorder, unspecified: Secondary | ICD-10-CM | POA: Diagnosis not present

## 2015-06-10 DIAGNOSIS — J449 Chronic obstructive pulmonary disease, unspecified: Secondary | ICD-10-CM | POA: Diagnosis not present

## 2015-06-10 DIAGNOSIS — F329 Major depressive disorder, single episode, unspecified: Secondary | ICD-10-CM | POA: Diagnosis not present

## 2015-06-10 DIAGNOSIS — K219 Gastro-esophageal reflux disease without esophagitis: Secondary | ICD-10-CM | POA: Diagnosis not present

## 2015-06-10 DIAGNOSIS — M1990 Unspecified osteoarthritis, unspecified site: Secondary | ICD-10-CM | POA: Diagnosis not present

## 2015-06-14 DIAGNOSIS — T18128A Food in esophagus causing other injury, initial encounter: Secondary | ICD-10-CM | POA: Diagnosis not present

## 2015-06-14 DIAGNOSIS — T17208A Unspecified foreign body in pharynx causing other injury, initial encounter: Secondary | ICD-10-CM | POA: Diagnosis not present

## 2015-06-14 DIAGNOSIS — I2510 Atherosclerotic heart disease of native coronary artery without angina pectoris: Secondary | ICD-10-CM | POA: Diagnosis not present

## 2015-06-14 DIAGNOSIS — I509 Heart failure, unspecified: Secondary | ICD-10-CM | POA: Diagnosis not present

## 2015-06-14 DIAGNOSIS — J449 Chronic obstructive pulmonary disease, unspecified: Secondary | ICD-10-CM | POA: Diagnosis not present

## 2015-06-14 DIAGNOSIS — K469 Unspecified abdominal hernia without obstruction or gangrene: Secondary | ICD-10-CM | POA: Diagnosis not present

## 2015-06-14 DIAGNOSIS — G20 Parkinson's disease: Secondary | ICD-10-CM | POA: Diagnosis not present

## 2015-06-16 DIAGNOSIS — E876 Hypokalemia: Secondary | ICD-10-CM | POA: Diagnosis not present

## 2015-06-16 DIAGNOSIS — I10 Essential (primary) hypertension: Secondary | ICD-10-CM | POA: Diagnosis not present

## 2015-06-16 DIAGNOSIS — K319 Disease of stomach and duodenum, unspecified: Secondary | ICD-10-CM | POA: Diagnosis not present

## 2015-06-16 DIAGNOSIS — R1310 Dysphagia, unspecified: Secondary | ICD-10-CM | POA: Diagnosis not present

## 2015-06-16 DIAGNOSIS — K228 Other specified diseases of esophagus: Secondary | ICD-10-CM | POA: Diagnosis not present

## 2015-06-16 DIAGNOSIS — I509 Heart failure, unspecified: Secondary | ICD-10-CM | POA: Diagnosis not present

## 2015-06-16 DIAGNOSIS — K3189 Other diseases of stomach and duodenum: Secondary | ICD-10-CM | POA: Diagnosis not present

## 2015-06-16 DIAGNOSIS — J449 Chronic obstructive pulmonary disease, unspecified: Secondary | ICD-10-CM | POA: Diagnosis not present

## 2015-06-16 DIAGNOSIS — K449 Diaphragmatic hernia without obstruction or gangrene: Secondary | ICD-10-CM | POA: Diagnosis not present

## 2015-06-17 DIAGNOSIS — G20 Parkinson's disease: Secondary | ICD-10-CM | POA: Diagnosis not present

## 2015-06-17 DIAGNOSIS — J449 Chronic obstructive pulmonary disease, unspecified: Secondary | ICD-10-CM | POA: Diagnosis not present

## 2015-06-17 DIAGNOSIS — I509 Heart failure, unspecified: Secondary | ICD-10-CM | POA: Diagnosis not present

## 2015-06-17 DIAGNOSIS — D649 Anemia, unspecified: Secondary | ICD-10-CM | POA: Diagnosis not present

## 2015-06-17 DIAGNOSIS — F329 Major depressive disorder, single episode, unspecified: Secondary | ICD-10-CM | POA: Diagnosis not present

## 2015-06-17 DIAGNOSIS — Z9049 Acquired absence of other specified parts of digestive tract: Secondary | ICD-10-CM | POA: Diagnosis not present

## 2015-06-17 DIAGNOSIS — Z950 Presence of cardiac pacemaker: Secondary | ICD-10-CM | POA: Diagnosis not present

## 2015-06-17 DIAGNOSIS — Z8673 Personal history of transient ischemic attack (TIA), and cerebral infarction without residual deficits: Secondary | ICD-10-CM | POA: Diagnosis not present

## 2015-06-17 DIAGNOSIS — Z87440 Personal history of urinary (tract) infections: Secondary | ICD-10-CM | POA: Diagnosis not present

## 2015-06-17 DIAGNOSIS — F419 Anxiety disorder, unspecified: Secondary | ICD-10-CM | POA: Diagnosis not present

## 2015-06-17 DIAGNOSIS — I10 Essential (primary) hypertension: Secondary | ICD-10-CM | POA: Diagnosis not present

## 2015-06-17 DIAGNOSIS — R1310 Dysphagia, unspecified: Secondary | ICD-10-CM | POA: Diagnosis not present

## 2015-06-17 DIAGNOSIS — Z9181 History of falling: Secondary | ICD-10-CM | POA: Diagnosis not present

## 2015-06-23 DIAGNOSIS — R1310 Dysphagia, unspecified: Secondary | ICD-10-CM | POA: Diagnosis not present

## 2015-06-23 DIAGNOSIS — F419 Anxiety disorder, unspecified: Secondary | ICD-10-CM | POA: Diagnosis not present

## 2015-06-23 DIAGNOSIS — J449 Chronic obstructive pulmonary disease, unspecified: Secondary | ICD-10-CM | POA: Diagnosis not present

## 2015-06-23 DIAGNOSIS — I509 Heart failure, unspecified: Secondary | ICD-10-CM | POA: Diagnosis not present

## 2015-06-23 DIAGNOSIS — I10 Essential (primary) hypertension: Secondary | ICD-10-CM | POA: Diagnosis not present

## 2015-06-23 DIAGNOSIS — G20 Parkinson's disease: Secondary | ICD-10-CM | POA: Diagnosis not present

## 2015-06-29 DIAGNOSIS — M2040 Other hammer toe(s) (acquired), unspecified foot: Secondary | ICD-10-CM | POA: Diagnosis not present

## 2015-06-29 DIAGNOSIS — M79676 Pain in unspecified toe(s): Secondary | ICD-10-CM | POA: Diagnosis not present

## 2015-06-29 DIAGNOSIS — L600 Ingrowing nail: Secondary | ICD-10-CM | POA: Diagnosis not present

## 2015-06-29 DIAGNOSIS — L03032 Cellulitis of left toe: Secondary | ICD-10-CM | POA: Diagnosis not present

## 2015-07-01 DIAGNOSIS — G20 Parkinson's disease: Secondary | ICD-10-CM | POA: Diagnosis not present

## 2015-07-01 DIAGNOSIS — J449 Chronic obstructive pulmonary disease, unspecified: Secondary | ICD-10-CM | POA: Diagnosis not present

## 2015-07-01 DIAGNOSIS — F419 Anxiety disorder, unspecified: Secondary | ICD-10-CM | POA: Diagnosis not present

## 2015-07-01 DIAGNOSIS — R1310 Dysphagia, unspecified: Secondary | ICD-10-CM | POA: Diagnosis not present

## 2015-07-01 DIAGNOSIS — I10 Essential (primary) hypertension: Secondary | ICD-10-CM | POA: Diagnosis not present

## 2015-07-01 DIAGNOSIS — I509 Heart failure, unspecified: Secondary | ICD-10-CM | POA: Diagnosis not present

## 2015-07-03 DIAGNOSIS — K029 Dental caries, unspecified: Secondary | ICD-10-CM | POA: Diagnosis not present

## 2015-07-07 DIAGNOSIS — F419 Anxiety disorder, unspecified: Secondary | ICD-10-CM | POA: Diagnosis not present

## 2015-07-07 DIAGNOSIS — R1310 Dysphagia, unspecified: Secondary | ICD-10-CM | POA: Diagnosis not present

## 2015-07-07 DIAGNOSIS — I10 Essential (primary) hypertension: Secondary | ICD-10-CM | POA: Diagnosis not present

## 2015-07-07 DIAGNOSIS — I509 Heart failure, unspecified: Secondary | ICD-10-CM | POA: Diagnosis not present

## 2015-07-07 DIAGNOSIS — G20 Parkinson's disease: Secondary | ICD-10-CM | POA: Diagnosis not present

## 2015-07-07 DIAGNOSIS — J449 Chronic obstructive pulmonary disease, unspecified: Secondary | ICD-10-CM | POA: Diagnosis not present

## 2015-07-11 DIAGNOSIS — I509 Heart failure, unspecified: Secondary | ICD-10-CM | POA: Diagnosis not present

## 2015-07-11 DIAGNOSIS — G20 Parkinson's disease: Secondary | ICD-10-CM | POA: Diagnosis not present

## 2015-07-11 DIAGNOSIS — R1310 Dysphagia, unspecified: Secondary | ICD-10-CM | POA: Diagnosis not present

## 2015-07-11 DIAGNOSIS — F419 Anxiety disorder, unspecified: Secondary | ICD-10-CM | POA: Diagnosis not present

## 2015-07-11 DIAGNOSIS — I10 Essential (primary) hypertension: Secondary | ICD-10-CM | POA: Diagnosis not present

## 2015-07-11 DIAGNOSIS — J449 Chronic obstructive pulmonary disease, unspecified: Secondary | ICD-10-CM | POA: Diagnosis not present

## 2015-07-12 DIAGNOSIS — F419 Anxiety disorder, unspecified: Secondary | ICD-10-CM | POA: Diagnosis not present

## 2015-07-12 DIAGNOSIS — R1310 Dysphagia, unspecified: Secondary | ICD-10-CM | POA: Diagnosis not present

## 2015-07-12 DIAGNOSIS — G20 Parkinson's disease: Secondary | ICD-10-CM | POA: Diagnosis not present

## 2015-07-12 DIAGNOSIS — I10 Essential (primary) hypertension: Secondary | ICD-10-CM | POA: Diagnosis not present

## 2015-07-12 DIAGNOSIS — J449 Chronic obstructive pulmonary disease, unspecified: Secondary | ICD-10-CM | POA: Diagnosis not present

## 2015-07-12 DIAGNOSIS — I509 Heart failure, unspecified: Secondary | ICD-10-CM | POA: Diagnosis not present

## 2015-07-17 DIAGNOSIS — R1310 Dysphagia, unspecified: Secondary | ICD-10-CM | POA: Diagnosis not present

## 2015-07-26 DIAGNOSIS — I10 Essential (primary) hypertension: Secondary | ICD-10-CM | POA: Diagnosis not present

## 2015-07-26 DIAGNOSIS — J449 Chronic obstructive pulmonary disease, unspecified: Secondary | ICD-10-CM | POA: Diagnosis not present

## 2015-07-26 DIAGNOSIS — I509 Heart failure, unspecified: Secondary | ICD-10-CM | POA: Diagnosis not present

## 2015-07-26 DIAGNOSIS — F419 Anxiety disorder, unspecified: Secondary | ICD-10-CM | POA: Diagnosis not present

## 2015-07-26 DIAGNOSIS — R1310 Dysphagia, unspecified: Secondary | ICD-10-CM | POA: Diagnosis not present

## 2015-07-26 DIAGNOSIS — G20 Parkinson's disease: Secondary | ICD-10-CM | POA: Diagnosis not present

## 2015-08-12 DIAGNOSIS — G20 Parkinson's disease: Secondary | ICD-10-CM | POA: Diagnosis not present

## 2015-08-12 DIAGNOSIS — I509 Heart failure, unspecified: Secondary | ICD-10-CM | POA: Diagnosis not present

## 2015-08-12 DIAGNOSIS — J449 Chronic obstructive pulmonary disease, unspecified: Secondary | ICD-10-CM | POA: Diagnosis not present

## 2015-08-12 DIAGNOSIS — R1310 Dysphagia, unspecified: Secondary | ICD-10-CM | POA: Diagnosis not present

## 2015-08-12 DIAGNOSIS — F419 Anxiety disorder, unspecified: Secondary | ICD-10-CM | POA: Diagnosis not present

## 2015-08-12 DIAGNOSIS — I10 Essential (primary) hypertension: Secondary | ICD-10-CM | POA: Diagnosis not present

## 2015-08-16 DIAGNOSIS — Z950 Presence of cardiac pacemaker: Secondary | ICD-10-CM | POA: Diagnosis not present

## 2015-08-16 DIAGNOSIS — D649 Anemia, unspecified: Secondary | ICD-10-CM | POA: Diagnosis not present

## 2015-08-16 DIAGNOSIS — I10 Essential (primary) hypertension: Secondary | ICD-10-CM | POA: Diagnosis not present

## 2015-08-16 DIAGNOSIS — Z8673 Personal history of transient ischemic attack (TIA), and cerebral infarction without residual deficits: Secondary | ICD-10-CM | POA: Diagnosis not present

## 2015-08-16 DIAGNOSIS — Z87440 Personal history of urinary (tract) infections: Secondary | ICD-10-CM | POA: Diagnosis not present

## 2015-08-16 DIAGNOSIS — G20 Parkinson's disease: Secondary | ICD-10-CM | POA: Diagnosis not present

## 2015-08-16 DIAGNOSIS — R1310 Dysphagia, unspecified: Secondary | ICD-10-CM | POA: Diagnosis not present

## 2015-08-16 DIAGNOSIS — I509 Heart failure, unspecified: Secondary | ICD-10-CM | POA: Diagnosis not present

## 2015-08-16 DIAGNOSIS — Z9181 History of falling: Secondary | ICD-10-CM | POA: Diagnosis not present

## 2015-08-16 DIAGNOSIS — F329 Major depressive disorder, single episode, unspecified: Secondary | ICD-10-CM | POA: Diagnosis not present

## 2015-08-16 DIAGNOSIS — J449 Chronic obstructive pulmonary disease, unspecified: Secondary | ICD-10-CM | POA: Diagnosis not present

## 2015-08-16 DIAGNOSIS — Z9049 Acquired absence of other specified parts of digestive tract: Secondary | ICD-10-CM | POA: Diagnosis not present

## 2015-08-16 DIAGNOSIS — F419 Anxiety disorder, unspecified: Secondary | ICD-10-CM | POA: Diagnosis not present

## 2015-08-23 DIAGNOSIS — G20 Parkinson's disease: Secondary | ICD-10-CM | POA: Diagnosis not present

## 2015-08-23 DIAGNOSIS — I509 Heart failure, unspecified: Secondary | ICD-10-CM | POA: Diagnosis not present

## 2015-08-23 DIAGNOSIS — F419 Anxiety disorder, unspecified: Secondary | ICD-10-CM | POA: Diagnosis not present

## 2015-08-23 DIAGNOSIS — R1310 Dysphagia, unspecified: Secondary | ICD-10-CM | POA: Diagnosis not present

## 2015-08-23 DIAGNOSIS — I10 Essential (primary) hypertension: Secondary | ICD-10-CM | POA: Diagnosis not present

## 2015-08-23 DIAGNOSIS — J449 Chronic obstructive pulmonary disease, unspecified: Secondary | ICD-10-CM | POA: Diagnosis not present

## 2015-09-02 DIAGNOSIS — I509 Heart failure, unspecified: Secondary | ICD-10-CM | POA: Diagnosis not present

## 2015-09-02 DIAGNOSIS — R1310 Dysphagia, unspecified: Secondary | ICD-10-CM | POA: Diagnosis not present

## 2015-09-02 DIAGNOSIS — I10 Essential (primary) hypertension: Secondary | ICD-10-CM | POA: Diagnosis not present

## 2015-09-02 DIAGNOSIS — F419 Anxiety disorder, unspecified: Secondary | ICD-10-CM | POA: Diagnosis not present

## 2015-09-02 DIAGNOSIS — G20 Parkinson's disease: Secondary | ICD-10-CM | POA: Diagnosis not present

## 2015-09-02 DIAGNOSIS — J449 Chronic obstructive pulmonary disease, unspecified: Secondary | ICD-10-CM | POA: Diagnosis not present

## 2015-09-08 DIAGNOSIS — I340 Nonrheumatic mitral (valve) insufficiency: Secondary | ICD-10-CM | POA: Diagnosis not present

## 2015-09-08 DIAGNOSIS — I209 Angina pectoris, unspecified: Secondary | ICD-10-CM | POA: Diagnosis not present

## 2015-09-08 DIAGNOSIS — Z950 Presence of cardiac pacemaker: Secondary | ICD-10-CM | POA: Diagnosis not present

## 2015-09-08 DIAGNOSIS — I10 Essential (primary) hypertension: Secondary | ICD-10-CM | POA: Diagnosis not present

## 2015-09-08 DIAGNOSIS — I272 Other secondary pulmonary hypertension: Secondary | ICD-10-CM | POA: Diagnosis not present

## 2015-09-08 DIAGNOSIS — E782 Mixed hyperlipidemia: Secondary | ICD-10-CM | POA: Diagnosis not present

## 2015-09-08 DIAGNOSIS — R0602 Shortness of breath: Secondary | ICD-10-CM | POA: Diagnosis not present

## 2015-09-08 DIAGNOSIS — I071 Rheumatic tricuspid insufficiency: Secondary | ICD-10-CM | POA: Diagnosis not present

## 2015-09-08 DIAGNOSIS — I351 Nonrheumatic aortic (valve) insufficiency: Secondary | ICD-10-CM | POA: Diagnosis not present

## 2015-09-08 DIAGNOSIS — I5032 Chronic diastolic (congestive) heart failure: Secondary | ICD-10-CM | POA: Diagnosis not present

## 2015-09-08 DIAGNOSIS — R002 Palpitations: Secondary | ICD-10-CM | POA: Diagnosis not present

## 2015-09-08 DIAGNOSIS — K449 Diaphragmatic hernia without obstruction or gangrene: Secondary | ICD-10-CM | POA: Diagnosis not present

## 2015-09-10 DIAGNOSIS — J449 Chronic obstructive pulmonary disease, unspecified: Secondary | ICD-10-CM | POA: Diagnosis not present

## 2015-09-10 DIAGNOSIS — R1310 Dysphagia, unspecified: Secondary | ICD-10-CM | POA: Diagnosis not present

## 2015-09-10 DIAGNOSIS — G20 Parkinson's disease: Secondary | ICD-10-CM | POA: Diagnosis not present

## 2015-09-10 DIAGNOSIS — I509 Heart failure, unspecified: Secondary | ICD-10-CM | POA: Diagnosis not present

## 2015-09-10 DIAGNOSIS — F419 Anxiety disorder, unspecified: Secondary | ICD-10-CM | POA: Diagnosis not present

## 2015-09-10 DIAGNOSIS — I10 Essential (primary) hypertension: Secondary | ICD-10-CM | POA: Diagnosis not present

## 2015-09-12 DIAGNOSIS — I517 Cardiomegaly: Secondary | ICD-10-CM | POA: Diagnosis not present

## 2015-09-12 DIAGNOSIS — I051 Rheumatic mitral insufficiency: Secondary | ICD-10-CM | POA: Diagnosis not present

## 2015-09-12 DIAGNOSIS — I5032 Chronic diastolic (congestive) heart failure: Secondary | ICD-10-CM | POA: Diagnosis not present

## 2015-09-15 DIAGNOSIS — R1310 Dysphagia, unspecified: Secondary | ICD-10-CM | POA: Diagnosis not present

## 2015-09-15 DIAGNOSIS — F419 Anxiety disorder, unspecified: Secondary | ICD-10-CM | POA: Diagnosis not present

## 2015-09-15 DIAGNOSIS — J449 Chronic obstructive pulmonary disease, unspecified: Secondary | ICD-10-CM | POA: Diagnosis not present

## 2015-09-15 DIAGNOSIS — G20 Parkinson's disease: Secondary | ICD-10-CM | POA: Diagnosis not present

## 2015-09-15 DIAGNOSIS — I10 Essential (primary) hypertension: Secondary | ICD-10-CM | POA: Diagnosis not present

## 2015-09-15 DIAGNOSIS — I509 Heart failure, unspecified: Secondary | ICD-10-CM | POA: Diagnosis not present

## 2015-09-18 DIAGNOSIS — I509 Heart failure, unspecified: Secondary | ICD-10-CM | POA: Diagnosis not present

## 2015-10-04 DIAGNOSIS — I209 Angina pectoris, unspecified: Secondary | ICD-10-CM | POA: Diagnosis not present

## 2015-10-06 DIAGNOSIS — R002 Palpitations: Secondary | ICD-10-CM | POA: Diagnosis not present

## 2015-10-06 DIAGNOSIS — I10 Essential (primary) hypertension: Secondary | ICD-10-CM | POA: Diagnosis not present

## 2015-10-06 DIAGNOSIS — I351 Nonrheumatic aortic (valve) insufficiency: Secondary | ICD-10-CM | POA: Diagnosis not present

## 2015-10-06 DIAGNOSIS — I209 Angina pectoris, unspecified: Secondary | ICD-10-CM | POA: Diagnosis not present

## 2015-10-06 DIAGNOSIS — R0602 Shortness of breath: Secondary | ICD-10-CM | POA: Diagnosis not present

## 2015-10-06 DIAGNOSIS — I071 Rheumatic tricuspid insufficiency: Secondary | ICD-10-CM | POA: Diagnosis not present

## 2015-10-06 DIAGNOSIS — I5032 Chronic diastolic (congestive) heart failure: Secondary | ICD-10-CM | POA: Diagnosis not present

## 2015-10-06 DIAGNOSIS — I639 Cerebral infarction, unspecified: Secondary | ICD-10-CM | POA: Diagnosis not present

## 2015-10-06 DIAGNOSIS — I272 Other secondary pulmonary hypertension: Secondary | ICD-10-CM | POA: Diagnosis not present

## 2015-10-06 DIAGNOSIS — I340 Nonrheumatic mitral (valve) insufficiency: Secondary | ICD-10-CM | POA: Diagnosis not present

## 2015-10-06 DIAGNOSIS — E782 Mixed hyperlipidemia: Secondary | ICD-10-CM | POA: Diagnosis not present

## 2015-10-06 DIAGNOSIS — K449 Diaphragmatic hernia without obstruction or gangrene: Secondary | ICD-10-CM | POA: Diagnosis not present

## 2015-10-07 DIAGNOSIS — F419 Anxiety disorder, unspecified: Secondary | ICD-10-CM | POA: Diagnosis not present

## 2015-10-07 DIAGNOSIS — I10 Essential (primary) hypertension: Secondary | ICD-10-CM | POA: Diagnosis not present

## 2015-10-07 DIAGNOSIS — I509 Heart failure, unspecified: Secondary | ICD-10-CM | POA: Diagnosis not present

## 2015-10-07 DIAGNOSIS — G20 Parkinson's disease: Secondary | ICD-10-CM | POA: Diagnosis not present

## 2015-10-07 DIAGNOSIS — R1310 Dysphagia, unspecified: Secondary | ICD-10-CM | POA: Diagnosis not present

## 2015-10-07 DIAGNOSIS — J449 Chronic obstructive pulmonary disease, unspecified: Secondary | ICD-10-CM | POA: Diagnosis not present

## 2015-10-11 DIAGNOSIS — I10 Essential (primary) hypertension: Secondary | ICD-10-CM | POA: Diagnosis not present

## 2015-10-11 DIAGNOSIS — G20 Parkinson's disease: Secondary | ICD-10-CM | POA: Diagnosis not present

## 2015-10-11 DIAGNOSIS — I509 Heart failure, unspecified: Secondary | ICD-10-CM | POA: Diagnosis not present

## 2015-10-11 DIAGNOSIS — F419 Anxiety disorder, unspecified: Secondary | ICD-10-CM | POA: Diagnosis not present

## 2015-10-11 DIAGNOSIS — R1310 Dysphagia, unspecified: Secondary | ICD-10-CM | POA: Diagnosis not present

## 2015-10-11 DIAGNOSIS — J449 Chronic obstructive pulmonary disease, unspecified: Secondary | ICD-10-CM | POA: Diagnosis not present

## 2015-10-14 DIAGNOSIS — I5032 Chronic diastolic (congestive) heart failure: Secondary | ICD-10-CM | POA: Diagnosis not present

## 2015-10-15 DIAGNOSIS — Z9181 History of falling: Secondary | ICD-10-CM | POA: Diagnosis not present

## 2015-10-15 DIAGNOSIS — G20 Parkinson's disease: Secondary | ICD-10-CM | POA: Diagnosis not present

## 2015-10-15 DIAGNOSIS — I509 Heart failure, unspecified: Secondary | ICD-10-CM | POA: Diagnosis not present

## 2015-10-15 DIAGNOSIS — D649 Anemia, unspecified: Secondary | ICD-10-CM | POA: Diagnosis not present

## 2015-10-15 DIAGNOSIS — Z9049 Acquired absence of other specified parts of digestive tract: Secondary | ICD-10-CM | POA: Diagnosis not present

## 2015-10-15 DIAGNOSIS — I10 Essential (primary) hypertension: Secondary | ICD-10-CM | POA: Diagnosis not present

## 2015-10-15 DIAGNOSIS — F329 Major depressive disorder, single episode, unspecified: Secondary | ICD-10-CM | POA: Diagnosis not present

## 2015-10-15 DIAGNOSIS — R1310 Dysphagia, unspecified: Secondary | ICD-10-CM | POA: Diagnosis not present

## 2015-10-15 DIAGNOSIS — F419 Anxiety disorder, unspecified: Secondary | ICD-10-CM | POA: Diagnosis not present

## 2015-10-15 DIAGNOSIS — J449 Chronic obstructive pulmonary disease, unspecified: Secondary | ICD-10-CM | POA: Diagnosis not present

## 2015-10-15 DIAGNOSIS — Z87440 Personal history of urinary (tract) infections: Secondary | ICD-10-CM | POA: Diagnosis not present

## 2015-10-15 DIAGNOSIS — Z8673 Personal history of transient ischemic attack (TIA), and cerebral infarction without residual deficits: Secondary | ICD-10-CM | POA: Diagnosis not present

## 2015-10-15 DIAGNOSIS — Z950 Presence of cardiac pacemaker: Secondary | ICD-10-CM | POA: Diagnosis not present

## 2015-10-19 DIAGNOSIS — R1310 Dysphagia, unspecified: Secondary | ICD-10-CM | POA: Diagnosis not present

## 2015-10-19 DIAGNOSIS — I509 Heart failure, unspecified: Secondary | ICD-10-CM | POA: Diagnosis not present

## 2015-10-19 DIAGNOSIS — J449 Chronic obstructive pulmonary disease, unspecified: Secondary | ICD-10-CM | POA: Diagnosis not present

## 2015-10-19 DIAGNOSIS — F419 Anxiety disorder, unspecified: Secondary | ICD-10-CM | POA: Diagnosis not present

## 2015-10-19 DIAGNOSIS — I10 Essential (primary) hypertension: Secondary | ICD-10-CM | POA: Diagnosis not present

## 2015-10-19 DIAGNOSIS — G20 Parkinson's disease: Secondary | ICD-10-CM | POA: Diagnosis not present

## 2015-10-21 DIAGNOSIS — R1310 Dysphagia, unspecified: Secondary | ICD-10-CM | POA: Diagnosis not present

## 2015-10-21 DIAGNOSIS — Z9181 History of falling: Secondary | ICD-10-CM | POA: Diagnosis not present

## 2015-10-21 DIAGNOSIS — I10 Essential (primary) hypertension: Secondary | ICD-10-CM | POA: Diagnosis not present

## 2015-10-21 DIAGNOSIS — F419 Anxiety disorder, unspecified: Secondary | ICD-10-CM | POA: Diagnosis not present

## 2015-10-21 DIAGNOSIS — I509 Heart failure, unspecified: Secondary | ICD-10-CM | POA: Diagnosis not present

## 2015-10-21 DIAGNOSIS — G20 Parkinson's disease: Secondary | ICD-10-CM | POA: Diagnosis not present

## 2015-10-21 DIAGNOSIS — R531 Weakness: Secondary | ICD-10-CM | POA: Diagnosis not present

## 2015-10-21 DIAGNOSIS — J449 Chronic obstructive pulmonary disease, unspecified: Secondary | ICD-10-CM | POA: Diagnosis not present

## 2015-10-22 DIAGNOSIS — F419 Anxiety disorder, unspecified: Secondary | ICD-10-CM | POA: Diagnosis not present

## 2015-10-22 DIAGNOSIS — G20 Parkinson's disease: Secondary | ICD-10-CM | POA: Diagnosis not present

## 2015-10-22 DIAGNOSIS — I509 Heart failure, unspecified: Secondary | ICD-10-CM | POA: Diagnosis not present

## 2015-10-22 DIAGNOSIS — R1310 Dysphagia, unspecified: Secondary | ICD-10-CM | POA: Diagnosis not present

## 2015-10-22 DIAGNOSIS — J449 Chronic obstructive pulmonary disease, unspecified: Secondary | ICD-10-CM | POA: Diagnosis not present

## 2015-10-22 DIAGNOSIS — I10 Essential (primary) hypertension: Secondary | ICD-10-CM | POA: Diagnosis not present

## 2015-10-24 DIAGNOSIS — R1310 Dysphagia, unspecified: Secondary | ICD-10-CM | POA: Diagnosis not present

## 2015-10-24 DIAGNOSIS — J449 Chronic obstructive pulmonary disease, unspecified: Secondary | ICD-10-CM | POA: Diagnosis not present

## 2015-10-24 DIAGNOSIS — I509 Heart failure, unspecified: Secondary | ICD-10-CM | POA: Diagnosis not present

## 2015-10-24 DIAGNOSIS — G20 Parkinson's disease: Secondary | ICD-10-CM | POA: Diagnosis not present

## 2015-10-24 DIAGNOSIS — F419 Anxiety disorder, unspecified: Secondary | ICD-10-CM | POA: Diagnosis not present

## 2015-10-24 DIAGNOSIS — I10 Essential (primary) hypertension: Secondary | ICD-10-CM | POA: Diagnosis not present

## 2015-10-27 DIAGNOSIS — G20 Parkinson's disease: Secondary | ICD-10-CM | POA: Diagnosis not present

## 2015-10-27 DIAGNOSIS — J449 Chronic obstructive pulmonary disease, unspecified: Secondary | ICD-10-CM | POA: Diagnosis not present

## 2015-10-27 DIAGNOSIS — I10 Essential (primary) hypertension: Secondary | ICD-10-CM | POA: Diagnosis not present

## 2015-10-27 DIAGNOSIS — R1310 Dysphagia, unspecified: Secondary | ICD-10-CM | POA: Diagnosis not present

## 2015-10-27 DIAGNOSIS — F419 Anxiety disorder, unspecified: Secondary | ICD-10-CM | POA: Diagnosis not present

## 2015-10-27 DIAGNOSIS — I509 Heart failure, unspecified: Secondary | ICD-10-CM | POA: Diagnosis not present

## 2015-10-30 DIAGNOSIS — I509 Heart failure, unspecified: Secondary | ICD-10-CM | POA: Diagnosis not present

## 2015-10-31 DIAGNOSIS — Z789 Other specified health status: Secondary | ICD-10-CM | POA: Diagnosis not present

## 2015-10-31 DIAGNOSIS — I10 Essential (primary) hypertension: Secondary | ICD-10-CM | POA: Diagnosis not present

## 2015-10-31 DIAGNOSIS — F419 Anxiety disorder, unspecified: Secondary | ICD-10-CM | POA: Diagnosis not present

## 2015-10-31 DIAGNOSIS — E669 Obesity, unspecified: Secondary | ICD-10-CM | POA: Diagnosis not present

## 2015-10-31 DIAGNOSIS — F329 Major depressive disorder, single episode, unspecified: Secondary | ICD-10-CM | POA: Diagnosis not present

## 2015-10-31 DIAGNOSIS — M1990 Unspecified osteoarthritis, unspecified site: Secondary | ICD-10-CM | POA: Diagnosis not present

## 2015-10-31 DIAGNOSIS — D649 Anemia, unspecified: Secondary | ICD-10-CM | POA: Diagnosis not present

## 2015-10-31 DIAGNOSIS — J449 Chronic obstructive pulmonary disease, unspecified: Secondary | ICD-10-CM | POA: Diagnosis not present

## 2015-10-31 DIAGNOSIS — Z8679 Personal history of other diseases of the circulatory system: Secondary | ICD-10-CM | POA: Diagnosis not present

## 2015-10-31 DIAGNOSIS — K219 Gastro-esophageal reflux disease without esophagitis: Secondary | ICD-10-CM | POA: Diagnosis not present

## 2015-10-31 DIAGNOSIS — G20 Parkinson's disease: Secondary | ICD-10-CM | POA: Diagnosis not present

## 2015-10-31 DIAGNOSIS — Z6830 Body mass index (BMI) 30.0-30.9, adult: Secondary | ICD-10-CM | POA: Diagnosis not present

## 2015-11-02 DIAGNOSIS — R1310 Dysphagia, unspecified: Secondary | ICD-10-CM | POA: Diagnosis not present

## 2015-11-02 DIAGNOSIS — I509 Heart failure, unspecified: Secondary | ICD-10-CM | POA: Diagnosis not present

## 2015-11-02 DIAGNOSIS — K449 Diaphragmatic hernia without obstruction or gangrene: Secondary | ICD-10-CM | POA: Diagnosis not present

## 2015-11-02 DIAGNOSIS — I272 Other secondary pulmonary hypertension: Secondary | ICD-10-CM | POA: Diagnosis not present

## 2015-11-02 DIAGNOSIS — I351 Nonrheumatic aortic (valve) insufficiency: Secondary | ICD-10-CM | POA: Diagnosis not present

## 2015-11-02 DIAGNOSIS — R0602 Shortness of breath: Secondary | ICD-10-CM | POA: Diagnosis not present

## 2015-11-02 DIAGNOSIS — I209 Angina pectoris, unspecified: Secondary | ICD-10-CM | POA: Diagnosis not present

## 2015-11-02 DIAGNOSIS — F419 Anxiety disorder, unspecified: Secondary | ICD-10-CM | POA: Diagnosis not present

## 2015-11-02 DIAGNOSIS — I340 Nonrheumatic mitral (valve) insufficiency: Secondary | ICD-10-CM | POA: Diagnosis not present

## 2015-11-02 DIAGNOSIS — I071 Rheumatic tricuspid insufficiency: Secondary | ICD-10-CM | POA: Diagnosis not present

## 2015-11-02 DIAGNOSIS — G20 Parkinson's disease: Secondary | ICD-10-CM | POA: Diagnosis not present

## 2015-11-02 DIAGNOSIS — I10 Essential (primary) hypertension: Secondary | ICD-10-CM | POA: Diagnosis not present

## 2015-11-02 DIAGNOSIS — J449 Chronic obstructive pulmonary disease, unspecified: Secondary | ICD-10-CM | POA: Diagnosis not present

## 2015-11-02 DIAGNOSIS — I5032 Chronic diastolic (congestive) heart failure: Secondary | ICD-10-CM | POA: Diagnosis not present

## 2015-11-02 DIAGNOSIS — E782 Mixed hyperlipidemia: Secondary | ICD-10-CM | POA: Diagnosis not present

## 2015-11-02 DIAGNOSIS — R002 Palpitations: Secondary | ICD-10-CM | POA: Diagnosis not present

## 2015-11-02 DIAGNOSIS — I639 Cerebral infarction, unspecified: Secondary | ICD-10-CM | POA: Diagnosis not present

## 2015-11-09 DIAGNOSIS — S52613A Displaced fracture of unspecified ulna styloid process, initial encounter for closed fracture: Secondary | ICD-10-CM | POA: Diagnosis not present

## 2015-11-09 DIAGNOSIS — S52531A Colles' fracture of right radius, initial encounter for closed fracture: Secondary | ICD-10-CM | POA: Diagnosis not present

## 2015-11-09 DIAGNOSIS — S52501A Unspecified fracture of the lower end of right radius, initial encounter for closed fracture: Secondary | ICD-10-CM | POA: Diagnosis not present

## 2015-11-09 DIAGNOSIS — M25531 Pain in right wrist: Secondary | ICD-10-CM | POA: Diagnosis not present

## 2015-11-09 DIAGNOSIS — S6981XA Other specified injuries of right wrist, hand and finger(s), initial encounter: Secondary | ICD-10-CM | POA: Diagnosis not present

## 2015-11-09 DIAGNOSIS — S52611A Displaced fracture of right ulna styloid process, initial encounter for closed fracture: Secondary | ICD-10-CM | POA: Diagnosis not present

## 2015-11-11 DIAGNOSIS — G20 Parkinson's disease: Secondary | ICD-10-CM | POA: Diagnosis not present

## 2015-11-11 DIAGNOSIS — R1310 Dysphagia, unspecified: Secondary | ICD-10-CM | POA: Diagnosis not present

## 2015-11-11 DIAGNOSIS — F419 Anxiety disorder, unspecified: Secondary | ICD-10-CM | POA: Diagnosis not present

## 2015-11-11 DIAGNOSIS — J449 Chronic obstructive pulmonary disease, unspecified: Secondary | ICD-10-CM | POA: Diagnosis not present

## 2015-11-11 DIAGNOSIS — I509 Heart failure, unspecified: Secondary | ICD-10-CM | POA: Diagnosis not present

## 2015-11-11 DIAGNOSIS — I10 Essential (primary) hypertension: Secondary | ICD-10-CM | POA: Diagnosis not present

## 2015-11-14 DIAGNOSIS — I10 Essential (primary) hypertension: Secondary | ICD-10-CM | POA: Diagnosis not present

## 2015-11-14 DIAGNOSIS — F419 Anxiety disorder, unspecified: Secondary | ICD-10-CM | POA: Diagnosis not present

## 2015-11-14 DIAGNOSIS — G20 Parkinson's disease: Secondary | ICD-10-CM | POA: Diagnosis not present

## 2015-11-14 DIAGNOSIS — J449 Chronic obstructive pulmonary disease, unspecified: Secondary | ICD-10-CM | POA: Diagnosis not present

## 2015-11-14 DIAGNOSIS — I509 Heart failure, unspecified: Secondary | ICD-10-CM | POA: Diagnosis not present

## 2015-11-14 DIAGNOSIS — R1310 Dysphagia, unspecified: Secondary | ICD-10-CM | POA: Diagnosis not present

## 2015-11-16 DIAGNOSIS — M25539 Pain in unspecified wrist: Secondary | ICD-10-CM | POA: Diagnosis not present

## 2015-11-16 DIAGNOSIS — S52531A Colles' fracture of right radius, initial encounter for closed fracture: Secondary | ICD-10-CM | POA: Diagnosis not present

## 2015-11-23 DIAGNOSIS — I10 Essential (primary) hypertension: Secondary | ICD-10-CM | POA: Diagnosis not present

## 2015-11-23 DIAGNOSIS — J449 Chronic obstructive pulmonary disease, unspecified: Secondary | ICD-10-CM | POA: Diagnosis not present

## 2015-11-23 DIAGNOSIS — Z8673 Personal history of transient ischemic attack (TIA), and cerebral infarction without residual deficits: Secondary | ICD-10-CM | POA: Diagnosis not present

## 2015-11-23 DIAGNOSIS — I2510 Atherosclerotic heart disease of native coronary artery without angina pectoris: Secondary | ICD-10-CM | POA: Diagnosis not present

## 2015-11-23 DIAGNOSIS — R1310 Dysphagia, unspecified: Secondary | ICD-10-CM | POA: Diagnosis not present

## 2015-11-23 DIAGNOSIS — R05 Cough: Secondary | ICD-10-CM | POA: Diagnosis not present

## 2015-11-29 DIAGNOSIS — S52531A Colles' fracture of right radius, initial encounter for closed fracture: Secondary | ICD-10-CM | POA: Diagnosis not present

## 2015-11-30 DIAGNOSIS — I480 Paroxysmal atrial fibrillation: Secondary | ICD-10-CM | POA: Diagnosis not present

## 2015-11-30 DIAGNOSIS — I495 Sick sinus syndrome: Secondary | ICD-10-CM | POA: Diagnosis not present

## 2015-11-30 DIAGNOSIS — I351 Nonrheumatic aortic (valve) insufficiency: Secondary | ICD-10-CM | POA: Diagnosis not present

## 2015-11-30 DIAGNOSIS — E785 Hyperlipidemia, unspecified: Secondary | ICD-10-CM | POA: Diagnosis not present

## 2015-11-30 DIAGNOSIS — I10 Essential (primary) hypertension: Secondary | ICD-10-CM | POA: Diagnosis not present

## 2015-11-30 DIAGNOSIS — I509 Heart failure, unspecified: Secondary | ICD-10-CM | POA: Diagnosis not present

## 2015-11-30 DIAGNOSIS — Z01810 Encounter for preprocedural cardiovascular examination: Secondary | ICD-10-CM | POA: Diagnosis not present

## 2015-12-01 DIAGNOSIS — S52501D Unspecified fracture of the lower end of right radius, subsequent encounter for closed fracture with routine healing: Secondary | ICD-10-CM | POA: Diagnosis not present

## 2015-12-01 DIAGNOSIS — S52611D Displaced fracture of right ulna styloid process, subsequent encounter for closed fracture with routine healing: Secondary | ICD-10-CM | POA: Diagnosis not present

## 2015-12-01 DIAGNOSIS — I5032 Chronic diastolic (congestive) heart failure: Secondary | ICD-10-CM | POA: Diagnosis not present

## 2015-12-01 DIAGNOSIS — I110 Hypertensive heart disease with heart failure: Secondary | ICD-10-CM | POA: Diagnosis not present

## 2015-12-01 DIAGNOSIS — M150 Primary generalized (osteo)arthritis: Secondary | ICD-10-CM | POA: Diagnosis not present

## 2015-12-01 DIAGNOSIS — G20 Parkinson's disease: Secondary | ICD-10-CM | POA: Diagnosis not present

## 2015-12-01 DIAGNOSIS — Z452 Encounter for adjustment and management of vascular access device: Secondary | ICD-10-CM | POA: Diagnosis not present

## 2015-12-05 DIAGNOSIS — I110 Hypertensive heart disease with heart failure: Secondary | ICD-10-CM | POA: Diagnosis not present

## 2015-12-05 DIAGNOSIS — M150 Primary generalized (osteo)arthritis: Secondary | ICD-10-CM | POA: Diagnosis not present

## 2015-12-05 DIAGNOSIS — S52501D Unspecified fracture of the lower end of right radius, subsequent encounter for closed fracture with routine healing: Secondary | ICD-10-CM | POA: Diagnosis not present

## 2015-12-05 DIAGNOSIS — S52611D Displaced fracture of right ulna styloid process, subsequent encounter for closed fracture with routine healing: Secondary | ICD-10-CM | POA: Diagnosis not present

## 2015-12-05 DIAGNOSIS — G20 Parkinson's disease: Secondary | ICD-10-CM | POA: Diagnosis not present

## 2015-12-05 DIAGNOSIS — I5032 Chronic diastolic (congestive) heart failure: Secondary | ICD-10-CM | POA: Diagnosis not present

## 2015-12-07 DIAGNOSIS — S52501D Unspecified fracture of the lower end of right radius, subsequent encounter for closed fracture with routine healing: Secondary | ICD-10-CM | POA: Diagnosis not present

## 2015-12-07 DIAGNOSIS — R05 Cough: Secondary | ICD-10-CM | POA: Diagnosis not present

## 2015-12-07 DIAGNOSIS — G20 Parkinson's disease: Secondary | ICD-10-CM | POA: Diagnosis not present

## 2015-12-07 DIAGNOSIS — I2510 Atherosclerotic heart disease of native coronary artery without angina pectoris: Secondary | ICD-10-CM | POA: Diagnosis not present

## 2015-12-07 DIAGNOSIS — I10 Essential (primary) hypertension: Secondary | ICD-10-CM | POA: Diagnosis not present

## 2015-12-07 DIAGNOSIS — I110 Hypertensive heart disease with heart failure: Secondary | ICD-10-CM | POA: Diagnosis not present

## 2015-12-07 DIAGNOSIS — K219 Gastro-esophageal reflux disease without esophagitis: Secondary | ICD-10-CM | POA: Diagnosis not present

## 2015-12-07 DIAGNOSIS — I5032 Chronic diastolic (congestive) heart failure: Secondary | ICD-10-CM | POA: Diagnosis not present

## 2015-12-07 DIAGNOSIS — R1310 Dysphagia, unspecified: Secondary | ICD-10-CM | POA: Diagnosis not present

## 2015-12-07 DIAGNOSIS — J449 Chronic obstructive pulmonary disease, unspecified: Secondary | ICD-10-CM | POA: Diagnosis not present

## 2015-12-07 DIAGNOSIS — S52611D Displaced fracture of right ulna styloid process, subsequent encounter for closed fracture with routine healing: Secondary | ICD-10-CM | POA: Diagnosis not present

## 2015-12-07 DIAGNOSIS — M150 Primary generalized (osteo)arthritis: Secondary | ICD-10-CM | POA: Diagnosis not present

## 2015-12-08 DIAGNOSIS — R200 Anesthesia of skin: Secondary | ICD-10-CM | POA: Diagnosis not present

## 2015-12-08 DIAGNOSIS — S52531D Colles' fracture of right radius, subsequent encounter for closed fracture with routine healing: Secondary | ICD-10-CM | POA: Diagnosis not present

## 2015-12-12 DIAGNOSIS — Z91048 Other nonmedicinal substance allergy status: Secondary | ICD-10-CM | POA: Diagnosis not present

## 2015-12-12 DIAGNOSIS — K219 Gastro-esophageal reflux disease without esophagitis: Secondary | ICD-10-CM | POA: Diagnosis not present

## 2015-12-12 DIAGNOSIS — Z8673 Personal history of transient ischemic attack (TIA), and cerebral infarction without residual deficits: Secondary | ICD-10-CM | POA: Diagnosis not present

## 2015-12-12 DIAGNOSIS — Z885 Allergy status to narcotic agent status: Secondary | ICD-10-CM | POA: Diagnosis not present

## 2015-12-12 DIAGNOSIS — I351 Nonrheumatic aortic (valve) insufficiency: Secondary | ICD-10-CM | POA: Diagnosis not present

## 2015-12-12 DIAGNOSIS — R1310 Dysphagia, unspecified: Secondary | ICD-10-CM | POA: Diagnosis not present

## 2015-12-12 DIAGNOSIS — M1990 Unspecified osteoarthritis, unspecified site: Secondary | ICD-10-CM | POA: Diagnosis not present

## 2015-12-12 DIAGNOSIS — S52531A Colles' fracture of right radius, initial encounter for closed fracture: Secondary | ICD-10-CM | POA: Diagnosis not present

## 2015-12-12 DIAGNOSIS — I10 Essential (primary) hypertension: Secondary | ICD-10-CM | POA: Diagnosis not present

## 2015-12-12 DIAGNOSIS — E785 Hyperlipidemia, unspecified: Secondary | ICD-10-CM | POA: Diagnosis not present

## 2015-12-12 DIAGNOSIS — I4891 Unspecified atrial fibrillation: Secondary | ICD-10-CM | POA: Diagnosis not present

## 2015-12-12 DIAGNOSIS — M810 Age-related osteoporosis without current pathological fracture: Secondary | ICD-10-CM | POA: Diagnosis not present

## 2015-12-12 DIAGNOSIS — J449 Chronic obstructive pulmonary disease, unspecified: Secondary | ICD-10-CM | POA: Diagnosis not present

## 2015-12-12 DIAGNOSIS — Z950 Presence of cardiac pacemaker: Secondary | ICD-10-CM | POA: Diagnosis not present

## 2015-12-12 DIAGNOSIS — G20 Parkinson's disease: Secondary | ICD-10-CM | POA: Diagnosis not present

## 2015-12-12 DIAGNOSIS — S52551A Other extraarticular fracture of lower end of right radius, initial encounter for closed fracture: Secondary | ICD-10-CM | POA: Diagnosis not present

## 2015-12-12 DIAGNOSIS — S52551P Other extraarticular fracture of lower end of right radius, subsequent encounter for closed fracture with malunion: Secondary | ICD-10-CM | POA: Diagnosis not present

## 2015-12-12 DIAGNOSIS — Z96649 Presence of unspecified artificial hip joint: Secondary | ICD-10-CM | POA: Diagnosis not present

## 2015-12-12 DIAGNOSIS — F418 Other specified anxiety disorders: Secondary | ICD-10-CM | POA: Diagnosis not present

## 2015-12-13 DIAGNOSIS — S52501D Unspecified fracture of the lower end of right radius, subsequent encounter for closed fracture with routine healing: Secondary | ICD-10-CM | POA: Diagnosis not present

## 2015-12-13 DIAGNOSIS — G20 Parkinson's disease: Secondary | ICD-10-CM | POA: Diagnosis not present

## 2015-12-13 DIAGNOSIS — S52611D Displaced fracture of right ulna styloid process, subsequent encounter for closed fracture with routine healing: Secondary | ICD-10-CM | POA: Diagnosis not present

## 2015-12-13 DIAGNOSIS — I5032 Chronic diastolic (congestive) heart failure: Secondary | ICD-10-CM | POA: Diagnosis not present

## 2015-12-13 DIAGNOSIS — I110 Hypertensive heart disease with heart failure: Secondary | ICD-10-CM | POA: Diagnosis not present

## 2015-12-13 DIAGNOSIS — M150 Primary generalized (osteo)arthritis: Secondary | ICD-10-CM | POA: Diagnosis not present

## 2015-12-14 DIAGNOSIS — I10 Essential (primary) hypertension: Secondary | ICD-10-CM | POA: Diagnosis not present

## 2015-12-14 DIAGNOSIS — K219 Gastro-esophageal reflux disease without esophagitis: Secondary | ICD-10-CM | POA: Diagnosis not present

## 2015-12-14 DIAGNOSIS — J449 Chronic obstructive pulmonary disease, unspecified: Secondary | ICD-10-CM | POA: Diagnosis not present

## 2015-12-14 DIAGNOSIS — I2510 Atherosclerotic heart disease of native coronary artery without angina pectoris: Secondary | ICD-10-CM | POA: Diagnosis not present

## 2015-12-14 DIAGNOSIS — R1310 Dysphagia, unspecified: Secondary | ICD-10-CM | POA: Diagnosis not present

## 2015-12-14 DIAGNOSIS — R05 Cough: Secondary | ICD-10-CM | POA: Diagnosis not present

## 2015-12-16 DIAGNOSIS — M150 Primary generalized (osteo)arthritis: Secondary | ICD-10-CM | POA: Diagnosis not present

## 2015-12-16 DIAGNOSIS — I110 Hypertensive heart disease with heart failure: Secondary | ICD-10-CM | POA: Diagnosis not present

## 2015-12-16 DIAGNOSIS — S52611D Displaced fracture of right ulna styloid process, subsequent encounter for closed fracture with routine healing: Secondary | ICD-10-CM | POA: Diagnosis not present

## 2015-12-16 DIAGNOSIS — S52531D Colles' fracture of right radius, subsequent encounter for closed fracture with routine healing: Secondary | ICD-10-CM | POA: Diagnosis not present

## 2015-12-16 DIAGNOSIS — I5032 Chronic diastolic (congestive) heart failure: Secondary | ICD-10-CM | POA: Diagnosis not present

## 2015-12-16 DIAGNOSIS — G20 Parkinson's disease: Secondary | ICD-10-CM | POA: Diagnosis not present

## 2015-12-16 DIAGNOSIS — S52501D Unspecified fracture of the lower end of right radius, subsequent encounter for closed fracture with routine healing: Secondary | ICD-10-CM | POA: Diagnosis not present

## 2015-12-28 DIAGNOSIS — E559 Vitamin D deficiency, unspecified: Secondary | ICD-10-CM | POA: Diagnosis not present

## 2015-12-28 DIAGNOSIS — Z452 Encounter for adjustment and management of vascular access device: Secondary | ICD-10-CM | POA: Diagnosis not present

## 2015-12-29 DIAGNOSIS — S52531D Colles' fracture of right radius, subsequent encounter for closed fracture with routine healing: Secondary | ICD-10-CM | POA: Diagnosis not present

## 2016-01-04 DIAGNOSIS — I10 Essential (primary) hypertension: Secondary | ICD-10-CM | POA: Diagnosis not present

## 2016-01-04 DIAGNOSIS — J449 Chronic obstructive pulmonary disease, unspecified: Secondary | ICD-10-CM | POA: Diagnosis not present

## 2016-01-04 DIAGNOSIS — R05 Cough: Secondary | ICD-10-CM | POA: Diagnosis not present

## 2016-01-04 DIAGNOSIS — R1310 Dysphagia, unspecified: Secondary | ICD-10-CM | POA: Diagnosis not present

## 2016-01-04 DIAGNOSIS — I2510 Atherosclerotic heart disease of native coronary artery without angina pectoris: Secondary | ICD-10-CM | POA: Diagnosis not present

## 2016-01-04 DIAGNOSIS — K219 Gastro-esophageal reflux disease without esophagitis: Secondary | ICD-10-CM | POA: Diagnosis not present

## 2016-01-16 DIAGNOSIS — S52531D Colles' fracture of right radius, subsequent encounter for closed fracture with routine healing: Secondary | ICD-10-CM | POA: Diagnosis not present

## 2016-01-18 DIAGNOSIS — S52531D Colles' fracture of right radius, subsequent encounter for closed fracture with routine healing: Secondary | ICD-10-CM | POA: Diagnosis not present

## 2016-01-18 DIAGNOSIS — M150 Primary generalized (osteo)arthritis: Secondary | ICD-10-CM | POA: Diagnosis not present

## 2016-01-18 DIAGNOSIS — I5032 Chronic diastolic (congestive) heart failure: Secondary | ICD-10-CM | POA: Diagnosis not present

## 2016-01-18 DIAGNOSIS — G20 Parkinson's disease: Secondary | ICD-10-CM | POA: Diagnosis not present

## 2016-01-18 DIAGNOSIS — I110 Hypertensive heart disease with heart failure: Secondary | ICD-10-CM | POA: Diagnosis not present

## 2016-01-18 DIAGNOSIS — S52611D Displaced fracture of right ulna styloid process, subsequent encounter for closed fracture with routine healing: Secondary | ICD-10-CM | POA: Diagnosis not present

## 2016-01-25 DIAGNOSIS — I110 Hypertensive heart disease with heart failure: Secondary | ICD-10-CM | POA: Diagnosis not present

## 2016-01-25 DIAGNOSIS — S52531D Colles' fracture of right radius, subsequent encounter for closed fracture with routine healing: Secondary | ICD-10-CM | POA: Diagnosis not present

## 2016-01-25 DIAGNOSIS — M150 Primary generalized (osteo)arthritis: Secondary | ICD-10-CM | POA: Diagnosis not present

## 2016-01-25 DIAGNOSIS — I5032 Chronic diastolic (congestive) heart failure: Secondary | ICD-10-CM | POA: Diagnosis not present

## 2016-01-25 DIAGNOSIS — S52611D Displaced fracture of right ulna styloid process, subsequent encounter for closed fracture with routine healing: Secondary | ICD-10-CM | POA: Diagnosis not present

## 2016-01-25 DIAGNOSIS — G20 Parkinson's disease: Secondary | ICD-10-CM | POA: Diagnosis not present

## 2016-01-27 DIAGNOSIS — I5032 Chronic diastolic (congestive) heart failure: Secondary | ICD-10-CM | POA: Diagnosis not present

## 2016-01-27 DIAGNOSIS — S52531D Colles' fracture of right radius, subsequent encounter for closed fracture with routine healing: Secondary | ICD-10-CM | POA: Diagnosis not present

## 2016-01-27 DIAGNOSIS — I110 Hypertensive heart disease with heart failure: Secondary | ICD-10-CM | POA: Diagnosis not present

## 2016-01-27 DIAGNOSIS — S52611D Displaced fracture of right ulna styloid process, subsequent encounter for closed fracture with routine healing: Secondary | ICD-10-CM | POA: Diagnosis not present

## 2016-01-27 DIAGNOSIS — M150 Primary generalized (osteo)arthritis: Secondary | ICD-10-CM | POA: Diagnosis not present

## 2016-01-27 DIAGNOSIS — G20 Parkinson's disease: Secondary | ICD-10-CM | POA: Diagnosis not present

## 2016-01-30 DIAGNOSIS — M150 Primary generalized (osteo)arthritis: Secondary | ICD-10-CM | POA: Diagnosis not present

## 2016-01-30 DIAGNOSIS — I5032 Chronic diastolic (congestive) heart failure: Secondary | ICD-10-CM | POA: Diagnosis not present

## 2016-01-30 DIAGNOSIS — S52531D Colles' fracture of right radius, subsequent encounter for closed fracture with routine healing: Secondary | ICD-10-CM | POA: Diagnosis not present

## 2016-01-30 DIAGNOSIS — S52611D Displaced fracture of right ulna styloid process, subsequent encounter for closed fracture with routine healing: Secondary | ICD-10-CM | POA: Diagnosis not present

## 2016-01-30 DIAGNOSIS — G20 Parkinson's disease: Secondary | ICD-10-CM | POA: Diagnosis not present

## 2016-01-30 DIAGNOSIS — I110 Hypertensive heart disease with heart failure: Secondary | ICD-10-CM | POA: Diagnosis not present

## 2016-02-01 DIAGNOSIS — S52611D Displaced fracture of right ulna styloid process, subsequent encounter for closed fracture with routine healing: Secondary | ICD-10-CM | POA: Diagnosis not present

## 2016-02-01 DIAGNOSIS — I110 Hypertensive heart disease with heart failure: Secondary | ICD-10-CM | POA: Diagnosis not present

## 2016-02-01 DIAGNOSIS — I5032 Chronic diastolic (congestive) heart failure: Secondary | ICD-10-CM | POA: Diagnosis not present

## 2016-02-01 DIAGNOSIS — S52531D Colles' fracture of right radius, subsequent encounter for closed fracture with routine healing: Secondary | ICD-10-CM | POA: Diagnosis not present

## 2016-02-01 DIAGNOSIS — G20 Parkinson's disease: Secondary | ICD-10-CM | POA: Diagnosis not present

## 2016-02-01 DIAGNOSIS — M150 Primary generalized (osteo)arthritis: Secondary | ICD-10-CM | POA: Diagnosis not present

## 2016-02-07 DIAGNOSIS — G20 Parkinson's disease: Secondary | ICD-10-CM | POA: Diagnosis not present

## 2016-02-07 DIAGNOSIS — S52611D Displaced fracture of right ulna styloid process, subsequent encounter for closed fracture with routine healing: Secondary | ICD-10-CM | POA: Diagnosis not present

## 2016-02-07 DIAGNOSIS — M150 Primary generalized (osteo)arthritis: Secondary | ICD-10-CM | POA: Diagnosis not present

## 2016-02-07 DIAGNOSIS — I5032 Chronic diastolic (congestive) heart failure: Secondary | ICD-10-CM | POA: Diagnosis not present

## 2016-02-07 DIAGNOSIS — S52531D Colles' fracture of right radius, subsequent encounter for closed fracture with routine healing: Secondary | ICD-10-CM | POA: Diagnosis not present

## 2016-02-07 DIAGNOSIS — I110 Hypertensive heart disease with heart failure: Secondary | ICD-10-CM | POA: Diagnosis not present

## 2016-02-08 DIAGNOSIS — I5032 Chronic diastolic (congestive) heart failure: Secondary | ICD-10-CM | POA: Diagnosis not present

## 2016-02-08 DIAGNOSIS — S52531D Colles' fracture of right radius, subsequent encounter for closed fracture with routine healing: Secondary | ICD-10-CM | POA: Diagnosis not present

## 2016-02-08 DIAGNOSIS — S52611D Displaced fracture of right ulna styloid process, subsequent encounter for closed fracture with routine healing: Secondary | ICD-10-CM | POA: Diagnosis not present

## 2016-02-08 DIAGNOSIS — M150 Primary generalized (osteo)arthritis: Secondary | ICD-10-CM | POA: Diagnosis not present

## 2016-02-08 DIAGNOSIS — G20 Parkinson's disease: Secondary | ICD-10-CM | POA: Diagnosis not present

## 2016-02-08 DIAGNOSIS — I110 Hypertensive heart disease with heart failure: Secondary | ICD-10-CM | POA: Diagnosis not present

## 2016-02-22 DIAGNOSIS — J449 Chronic obstructive pulmonary disease, unspecified: Secondary | ICD-10-CM | POA: Diagnosis not present

## 2016-02-22 DIAGNOSIS — I2510 Atherosclerotic heart disease of native coronary artery without angina pectoris: Secondary | ICD-10-CM | POA: Diagnosis not present

## 2016-02-22 DIAGNOSIS — Z8673 Personal history of transient ischemic attack (TIA), and cerebral infarction without residual deficits: Secondary | ICD-10-CM | POA: Diagnosis not present

## 2016-02-22 DIAGNOSIS — I4891 Unspecified atrial fibrillation: Secondary | ICD-10-CM | POA: Diagnosis not present

## 2016-03-07 DIAGNOSIS — Z8673 Personal history of transient ischemic attack (TIA), and cerebral infarction without residual deficits: Secondary | ICD-10-CM | POA: Diagnosis not present

## 2016-03-07 DIAGNOSIS — I2510 Atherosclerotic heart disease of native coronary artery without angina pectoris: Secondary | ICD-10-CM | POA: Diagnosis not present

## 2016-03-07 DIAGNOSIS — H6990 Unspecified Eustachian tube disorder, unspecified ear: Secondary | ICD-10-CM | POA: Diagnosis not present

## 2016-03-07 DIAGNOSIS — H9202 Otalgia, left ear: Secondary | ICD-10-CM | POA: Diagnosis not present

## 2016-03-07 DIAGNOSIS — J449 Chronic obstructive pulmonary disease, unspecified: Secondary | ICD-10-CM | POA: Diagnosis not present

## 2016-03-09 DIAGNOSIS — Z23 Encounter for immunization: Secondary | ICD-10-CM | POA: Diagnosis not present

## 2016-03-19 DIAGNOSIS — H93293 Other abnormal auditory perceptions, bilateral: Secondary | ICD-10-CM | POA: Diagnosis not present

## 2016-03-19 DIAGNOSIS — H903 Sensorineural hearing loss, bilateral: Secondary | ICD-10-CM | POA: Diagnosis not present

## 2016-03-21 DIAGNOSIS — J449 Chronic obstructive pulmonary disease, unspecified: Secondary | ICD-10-CM | POA: Diagnosis not present

## 2016-03-21 DIAGNOSIS — H6990 Unspecified Eustachian tube disorder, unspecified ear: Secondary | ICD-10-CM | POA: Diagnosis not present

## 2016-03-21 DIAGNOSIS — H9202 Otalgia, left ear: Secondary | ICD-10-CM | POA: Diagnosis not present

## 2016-03-21 DIAGNOSIS — I2510 Atherosclerotic heart disease of native coronary artery without angina pectoris: Secondary | ICD-10-CM | POA: Diagnosis not present

## 2016-03-21 DIAGNOSIS — Z8673 Personal history of transient ischemic attack (TIA), and cerebral infarction without residual deficits: Secondary | ICD-10-CM | POA: Diagnosis not present

## 2016-04-04 DIAGNOSIS — J449 Chronic obstructive pulmonary disease, unspecified: Secondary | ICD-10-CM | POA: Diagnosis not present

## 2016-04-04 DIAGNOSIS — I2510 Atherosclerotic heart disease of native coronary artery without angina pectoris: Secondary | ICD-10-CM | POA: Diagnosis not present

## 2016-04-04 DIAGNOSIS — H6990 Unspecified Eustachian tube disorder, unspecified ear: Secondary | ICD-10-CM | POA: Diagnosis not present

## 2016-04-04 DIAGNOSIS — Z8673 Personal history of transient ischemic attack (TIA), and cerebral infarction without residual deficits: Secondary | ICD-10-CM | POA: Diagnosis not present

## 2016-04-04 DIAGNOSIS — R634 Abnormal weight loss: Secondary | ICD-10-CM | POA: Diagnosis not present

## 2016-05-01 DIAGNOSIS — R002 Palpitations: Secondary | ICD-10-CM | POA: Diagnosis not present

## 2016-05-01 DIAGNOSIS — Z299 Encounter for prophylactic measures, unspecified: Secondary | ICD-10-CM | POA: Diagnosis not present

## 2016-05-01 DIAGNOSIS — J449 Chronic obstructive pulmonary disease, unspecified: Secondary | ICD-10-CM | POA: Diagnosis not present

## 2016-05-01 DIAGNOSIS — I509 Heart failure, unspecified: Secondary | ICD-10-CM | POA: Diagnosis not present

## 2016-05-01 DIAGNOSIS — I10 Essential (primary) hypertension: Secondary | ICD-10-CM | POA: Diagnosis not present

## 2016-05-04 DIAGNOSIS — Z452 Encounter for adjustment and management of vascular access device: Secondary | ICD-10-CM | POA: Diagnosis not present

## 2016-05-22 DIAGNOSIS — J449 Chronic obstructive pulmonary disease, unspecified: Secondary | ICD-10-CM | POA: Diagnosis not present

## 2016-05-22 DIAGNOSIS — I509 Heart failure, unspecified: Secondary | ICD-10-CM | POA: Diagnosis not present

## 2016-05-22 DIAGNOSIS — N390 Urinary tract infection, site not specified: Secondary | ICD-10-CM | POA: Diagnosis not present

## 2016-05-22 DIAGNOSIS — G20 Parkinson's disease: Secondary | ICD-10-CM | POA: Diagnosis not present

## 2016-05-22 DIAGNOSIS — Z299 Encounter for prophylactic measures, unspecified: Secondary | ICD-10-CM | POA: Diagnosis not present

## 2016-05-25 DIAGNOSIS — I10 Essential (primary) hypertension: Secondary | ICD-10-CM | POA: Diagnosis not present

## 2016-05-25 DIAGNOSIS — Z299 Encounter for prophylactic measures, unspecified: Secondary | ICD-10-CM | POA: Diagnosis not present

## 2016-05-25 DIAGNOSIS — Z789 Other specified health status: Secondary | ICD-10-CM | POA: Diagnosis not present

## 2016-05-25 DIAGNOSIS — F419 Anxiety disorder, unspecified: Secondary | ICD-10-CM | POA: Diagnosis not present

## 2016-05-25 DIAGNOSIS — Z6821 Body mass index (BMI) 21.0-21.9, adult: Secondary | ICD-10-CM | POA: Diagnosis not present

## 2016-05-25 DIAGNOSIS — G4700 Insomnia, unspecified: Secondary | ICD-10-CM | POA: Diagnosis not present

## 2016-06-01 DIAGNOSIS — Z452 Encounter for adjustment and management of vascular access device: Secondary | ICD-10-CM | POA: Diagnosis not present

## 2016-07-03 DIAGNOSIS — Z6821 Body mass index (BMI) 21.0-21.9, adult: Secondary | ICD-10-CM | POA: Diagnosis not present

## 2016-07-03 DIAGNOSIS — Z452 Encounter for adjustment and management of vascular access device: Secondary | ICD-10-CM | POA: Diagnosis not present

## 2016-07-03 DIAGNOSIS — E559 Vitamin D deficiency, unspecified: Secondary | ICD-10-CM | POA: Diagnosis not present

## 2016-07-03 DIAGNOSIS — Z713 Dietary counseling and surveillance: Secondary | ICD-10-CM | POA: Diagnosis not present

## 2016-07-03 DIAGNOSIS — J449 Chronic obstructive pulmonary disease, unspecified: Secondary | ICD-10-CM | POA: Diagnosis not present

## 2016-07-03 DIAGNOSIS — E785 Hyperlipidemia, unspecified: Secondary | ICD-10-CM | POA: Diagnosis not present

## 2016-07-03 DIAGNOSIS — F329 Major depressive disorder, single episode, unspecified: Secondary | ICD-10-CM | POA: Diagnosis not present

## 2016-07-03 DIAGNOSIS — F419 Anxiety disorder, unspecified: Secondary | ICD-10-CM | POA: Diagnosis not present

## 2016-07-03 DIAGNOSIS — G20 Parkinson's disease: Secondary | ICD-10-CM | POA: Diagnosis not present

## 2016-07-03 DIAGNOSIS — Z299 Encounter for prophylactic measures, unspecified: Secondary | ICD-10-CM | POA: Diagnosis not present

## 2016-07-11 DIAGNOSIS — Z789 Other specified health status: Secondary | ICD-10-CM | POA: Diagnosis not present

## 2016-07-11 DIAGNOSIS — I4891 Unspecified atrial fibrillation: Secondary | ICD-10-CM | POA: Diagnosis not present

## 2016-07-11 DIAGNOSIS — Z713 Dietary counseling and surveillance: Secondary | ICD-10-CM | POA: Diagnosis not present

## 2016-07-11 DIAGNOSIS — J449 Chronic obstructive pulmonary disease, unspecified: Secondary | ICD-10-CM | POA: Diagnosis not present

## 2016-07-11 DIAGNOSIS — J329 Chronic sinusitis, unspecified: Secondary | ICD-10-CM | POA: Diagnosis not present

## 2016-07-11 DIAGNOSIS — Z299 Encounter for prophylactic measures, unspecified: Secondary | ICD-10-CM | POA: Diagnosis not present

## 2016-07-11 DIAGNOSIS — I509 Heart failure, unspecified: Secondary | ICD-10-CM | POA: Diagnosis not present

## 2016-07-11 DIAGNOSIS — Z6821 Body mass index (BMI) 21.0-21.9, adult: Secondary | ICD-10-CM | POA: Diagnosis not present

## 2016-07-23 DIAGNOSIS — Z7902 Long term (current) use of antithrombotics/antiplatelets: Secondary | ICD-10-CM | POA: Diagnosis not present

## 2016-07-23 DIAGNOSIS — Z886 Allergy status to analgesic agent status: Secondary | ICD-10-CM | POA: Diagnosis not present

## 2016-07-23 DIAGNOSIS — M542 Cervicalgia: Secondary | ICD-10-CM | POA: Diagnosis not present

## 2016-07-23 DIAGNOSIS — I509 Heart failure, unspecified: Secondary | ICD-10-CM | POA: Diagnosis not present

## 2016-07-23 DIAGNOSIS — E785 Hyperlipidemia, unspecified: Secondary | ICD-10-CM | POA: Diagnosis not present

## 2016-07-23 DIAGNOSIS — Z9049 Acquired absence of other specified parts of digestive tract: Secondary | ICD-10-CM | POA: Diagnosis not present

## 2016-07-23 DIAGNOSIS — S22020B Wedge compression fracture of second thoracic vertebra, initial encounter for open fracture: Secondary | ICD-10-CM | POA: Diagnosis not present

## 2016-07-23 DIAGNOSIS — M25521 Pain in right elbow: Secondary | ICD-10-CM | POA: Diagnosis not present

## 2016-07-23 DIAGNOSIS — R51 Headache: Secondary | ICD-10-CM | POA: Diagnosis not present

## 2016-07-23 DIAGNOSIS — E860 Dehydration: Secondary | ICD-10-CM | POA: Diagnosis not present

## 2016-07-23 DIAGNOSIS — S199XXA Unspecified injury of neck, initial encounter: Secondary | ICD-10-CM | POA: Diagnosis not present

## 2016-07-23 DIAGNOSIS — I4891 Unspecified atrial fibrillation: Secondary | ICD-10-CM | POA: Diagnosis not present

## 2016-07-23 DIAGNOSIS — R55 Syncope and collapse: Secondary | ICD-10-CM | POA: Diagnosis not present

## 2016-07-23 DIAGNOSIS — J189 Pneumonia, unspecified organism: Secondary | ICD-10-CM | POA: Diagnosis not present

## 2016-07-23 DIAGNOSIS — S0990XA Unspecified injury of head, initial encounter: Secondary | ICD-10-CM | POA: Diagnosis not present

## 2016-07-23 DIAGNOSIS — J449 Chronic obstructive pulmonary disease, unspecified: Secondary | ICD-10-CM | POA: Diagnosis not present

## 2016-07-23 DIAGNOSIS — Z90710 Acquired absence of both cervix and uterus: Secondary | ICD-10-CM | POA: Diagnosis not present

## 2016-07-23 DIAGNOSIS — G20 Parkinson's disease: Secondary | ICD-10-CM | POA: Diagnosis not present

## 2016-07-23 DIAGNOSIS — W01198A Fall on same level from slipping, tripping and stumbling with subsequent striking against other object, initial encounter: Secondary | ICD-10-CM | POA: Diagnosis not present

## 2016-07-23 DIAGNOSIS — S299XXA Unspecified injury of thorax, initial encounter: Secondary | ICD-10-CM | POA: Diagnosis not present

## 2016-07-23 DIAGNOSIS — R110 Nausea: Secondary | ICD-10-CM | POA: Diagnosis not present

## 2016-07-23 DIAGNOSIS — R42 Dizziness and giddiness: Secondary | ICD-10-CM | POA: Diagnosis not present

## 2016-07-23 DIAGNOSIS — I2510 Atherosclerotic heart disease of native coronary artery without angina pectoris: Secondary | ICD-10-CM | POA: Diagnosis not present

## 2016-07-23 DIAGNOSIS — R531 Weakness: Secondary | ICD-10-CM | POA: Diagnosis not present

## 2016-07-23 DIAGNOSIS — I110 Hypertensive heart disease with heart failure: Secondary | ICD-10-CM | POA: Diagnosis not present

## 2016-07-23 DIAGNOSIS — S22020A Wedge compression fracture of second thoracic vertebra, initial encounter for closed fracture: Secondary | ICD-10-CM | POA: Diagnosis not present

## 2016-07-23 DIAGNOSIS — Z79899 Other long term (current) drug therapy: Secondary | ICD-10-CM | POA: Diagnosis not present

## 2016-07-23 DIAGNOSIS — S59901A Unspecified injury of right elbow, initial encounter: Secondary | ICD-10-CM | POA: Diagnosis not present

## 2016-07-23 DIAGNOSIS — R05 Cough: Secondary | ICD-10-CM | POA: Diagnosis not present

## 2016-07-23 DIAGNOSIS — Z950 Presence of cardiac pacemaker: Secondary | ICD-10-CM | POA: Diagnosis not present

## 2016-07-24 DIAGNOSIS — M25521 Pain in right elbow: Secondary | ICD-10-CM | POA: Diagnosis not present

## 2016-07-24 DIAGNOSIS — R55 Syncope and collapse: Secondary | ICD-10-CM | POA: Diagnosis not present

## 2016-07-24 DIAGNOSIS — R51 Headache: Secondary | ICD-10-CM | POA: Diagnosis not present

## 2016-07-24 DIAGNOSIS — R110 Nausea: Secondary | ICD-10-CM | POA: Diagnosis not present

## 2016-07-25 DIAGNOSIS — R110 Nausea: Secondary | ICD-10-CM | POA: Diagnosis not present

## 2016-07-25 DIAGNOSIS — R51 Headache: Secondary | ICD-10-CM | POA: Diagnosis not present

## 2016-07-25 DIAGNOSIS — R55 Syncope and collapse: Secondary | ICD-10-CM | POA: Diagnosis not present

## 2016-07-25 DIAGNOSIS — M25521 Pain in right elbow: Secondary | ICD-10-CM | POA: Diagnosis not present

## 2016-07-27 DIAGNOSIS — I2510 Atherosclerotic heart disease of native coronary artery without angina pectoris: Secondary | ICD-10-CM | POA: Diagnosis not present

## 2016-07-27 DIAGNOSIS — I4891 Unspecified atrial fibrillation: Secondary | ICD-10-CM | POA: Diagnosis not present

## 2016-07-27 DIAGNOSIS — R55 Syncope and collapse: Secondary | ICD-10-CM | POA: Diagnosis not present

## 2016-07-27 DIAGNOSIS — Z959 Presence of cardiac and vascular implant and graft, unspecified: Secondary | ICD-10-CM | POA: Diagnosis not present

## 2016-07-27 DIAGNOSIS — I110 Hypertensive heart disease with heart failure: Secondary | ICD-10-CM | POA: Diagnosis not present

## 2016-07-27 DIAGNOSIS — S22029D Unspecified fracture of second thoracic vertebra, subsequent encounter for fracture with routine healing: Secondary | ICD-10-CM | POA: Diagnosis not present

## 2016-07-27 DIAGNOSIS — J449 Chronic obstructive pulmonary disease, unspecified: Secondary | ICD-10-CM | POA: Diagnosis not present

## 2016-07-27 DIAGNOSIS — G20 Parkinson's disease: Secondary | ICD-10-CM | POA: Diagnosis not present

## 2016-07-27 DIAGNOSIS — I509 Heart failure, unspecified: Secondary | ICD-10-CM | POA: Diagnosis not present

## 2016-07-30 DIAGNOSIS — J449 Chronic obstructive pulmonary disease, unspecified: Secondary | ICD-10-CM | POA: Diagnosis not present

## 2016-07-30 DIAGNOSIS — I110 Hypertensive heart disease with heart failure: Secondary | ICD-10-CM | POA: Diagnosis not present

## 2016-07-30 DIAGNOSIS — I2510 Atherosclerotic heart disease of native coronary artery without angina pectoris: Secondary | ICD-10-CM | POA: Diagnosis not present

## 2016-07-30 DIAGNOSIS — G20 Parkinson's disease: Secondary | ICD-10-CM | POA: Diagnosis not present

## 2016-07-30 DIAGNOSIS — S22029D Unspecified fracture of second thoracic vertebra, subsequent encounter for fracture with routine healing: Secondary | ICD-10-CM | POA: Diagnosis not present

## 2016-07-30 DIAGNOSIS — R55 Syncope and collapse: Secondary | ICD-10-CM | POA: Diagnosis not present

## 2016-07-31 DIAGNOSIS — J449 Chronic obstructive pulmonary disease, unspecified: Secondary | ICD-10-CM | POA: Diagnosis not present

## 2016-07-31 DIAGNOSIS — S22029D Unspecified fracture of second thoracic vertebra, subsequent encounter for fracture with routine healing: Secondary | ICD-10-CM | POA: Diagnosis not present

## 2016-07-31 DIAGNOSIS — I2510 Atherosclerotic heart disease of native coronary artery without angina pectoris: Secondary | ICD-10-CM | POA: Diagnosis not present

## 2016-07-31 DIAGNOSIS — G20 Parkinson's disease: Secondary | ICD-10-CM | POA: Diagnosis not present

## 2016-07-31 DIAGNOSIS — Z452 Encounter for adjustment and management of vascular access device: Secondary | ICD-10-CM | POA: Diagnosis not present

## 2016-07-31 DIAGNOSIS — R55 Syncope and collapse: Secondary | ICD-10-CM | POA: Diagnosis not present

## 2016-07-31 DIAGNOSIS — Z789 Other specified health status: Secondary | ICD-10-CM | POA: Diagnosis not present

## 2016-07-31 DIAGNOSIS — I110 Hypertensive heart disease with heart failure: Secondary | ICD-10-CM | POA: Diagnosis not present

## 2016-08-01 DIAGNOSIS — I2510 Atherosclerotic heart disease of native coronary artery without angina pectoris: Secondary | ICD-10-CM | POA: Diagnosis not present

## 2016-08-01 DIAGNOSIS — J449 Chronic obstructive pulmonary disease, unspecified: Secondary | ICD-10-CM | POA: Diagnosis not present

## 2016-08-01 DIAGNOSIS — I110 Hypertensive heart disease with heart failure: Secondary | ICD-10-CM | POA: Diagnosis not present

## 2016-08-01 DIAGNOSIS — S22029D Unspecified fracture of second thoracic vertebra, subsequent encounter for fracture with routine healing: Secondary | ICD-10-CM | POA: Diagnosis not present

## 2016-08-01 DIAGNOSIS — G20 Parkinson's disease: Secondary | ICD-10-CM | POA: Diagnosis not present

## 2016-08-01 DIAGNOSIS — R55 Syncope and collapse: Secondary | ICD-10-CM | POA: Diagnosis not present

## 2016-08-15 DIAGNOSIS — J449 Chronic obstructive pulmonary disease, unspecified: Secondary | ICD-10-CM | POA: Diagnosis not present

## 2016-08-15 DIAGNOSIS — I110 Hypertensive heart disease with heart failure: Secondary | ICD-10-CM | POA: Diagnosis not present

## 2016-08-15 DIAGNOSIS — G20 Parkinson's disease: Secondary | ICD-10-CM | POA: Diagnosis not present

## 2016-08-15 DIAGNOSIS — S22029D Unspecified fracture of second thoracic vertebra, subsequent encounter for fracture with routine healing: Secondary | ICD-10-CM | POA: Diagnosis not present

## 2016-08-15 DIAGNOSIS — R55 Syncope and collapse: Secondary | ICD-10-CM | POA: Diagnosis not present

## 2016-08-15 DIAGNOSIS — I2510 Atherosclerotic heart disease of native coronary artery without angina pectoris: Secondary | ICD-10-CM | POA: Diagnosis not present

## 2016-08-28 DIAGNOSIS — Z452 Encounter for adjustment and management of vascular access device: Secondary | ICD-10-CM | POA: Diagnosis not present

## 2016-09-06 DIAGNOSIS — N390 Urinary tract infection, site not specified: Secondary | ICD-10-CM | POA: Diagnosis not present

## 2016-09-06 DIAGNOSIS — Z6820 Body mass index (BMI) 20.0-20.9, adult: Secondary | ICD-10-CM | POA: Diagnosis not present

## 2016-09-06 DIAGNOSIS — I10 Essential (primary) hypertension: Secondary | ICD-10-CM | POA: Diagnosis not present

## 2016-09-06 DIAGNOSIS — F419 Anxiety disorder, unspecified: Secondary | ICD-10-CM | POA: Diagnosis not present

## 2016-09-06 DIAGNOSIS — G20 Parkinson's disease: Secondary | ICD-10-CM | POA: Diagnosis not present

## 2016-09-06 DIAGNOSIS — Z299 Encounter for prophylactic measures, unspecified: Secondary | ICD-10-CM | POA: Diagnosis not present

## 2016-09-25 DIAGNOSIS — Z452 Encounter for adjustment and management of vascular access device: Secondary | ICD-10-CM | POA: Diagnosis not present

## 2016-09-27 ENCOUNTER — Ambulatory Visit: Payer: Self-pay | Admitting: Cardiovascular Disease

## 2016-10-04 DIAGNOSIS — I10 Essential (primary) hypertension: Secondary | ICD-10-CM | POA: Diagnosis not present

## 2016-10-04 DIAGNOSIS — E785 Hyperlipidemia, unspecified: Secondary | ICD-10-CM | POA: Diagnosis not present

## 2016-10-04 DIAGNOSIS — I509 Heart failure, unspecified: Secondary | ICD-10-CM | POA: Diagnosis not present

## 2016-10-04 DIAGNOSIS — K219 Gastro-esophageal reflux disease without esophagitis: Secondary | ICD-10-CM | POA: Diagnosis not present

## 2016-10-04 DIAGNOSIS — J449 Chronic obstructive pulmonary disease, unspecified: Secondary | ICD-10-CM | POA: Diagnosis not present

## 2016-10-04 DIAGNOSIS — Z6820 Body mass index (BMI) 20.0-20.9, adult: Secondary | ICD-10-CM | POA: Diagnosis not present

## 2016-10-04 DIAGNOSIS — F329 Major depressive disorder, single episode, unspecified: Secondary | ICD-10-CM | POA: Diagnosis not present

## 2016-10-04 DIAGNOSIS — F419 Anxiety disorder, unspecified: Secondary | ICD-10-CM | POA: Diagnosis not present

## 2016-10-04 DIAGNOSIS — I4891 Unspecified atrial fibrillation: Secondary | ICD-10-CM | POA: Diagnosis not present

## 2016-10-04 DIAGNOSIS — R42 Dizziness and giddiness: Secondary | ICD-10-CM | POA: Diagnosis not present

## 2016-10-04 DIAGNOSIS — G20 Parkinson's disease: Secondary | ICD-10-CM | POA: Diagnosis not present

## 2016-10-04 DIAGNOSIS — Z299 Encounter for prophylactic measures, unspecified: Secondary | ICD-10-CM | POA: Diagnosis not present

## 2016-10-12 ENCOUNTER — Encounter: Payer: Self-pay | Admitting: *Deleted

## 2016-10-16 ENCOUNTER — Encounter: Payer: Self-pay | Admitting: Cardiovascular Disease

## 2016-10-16 ENCOUNTER — Encounter: Payer: Self-pay | Admitting: *Deleted

## 2016-10-16 ENCOUNTER — Ambulatory Visit (INDEPENDENT_AMBULATORY_CARE_PROVIDER_SITE_OTHER): Payer: Medicare Other | Admitting: Cardiovascular Disease

## 2016-10-16 VITALS — BP 168/83 | HR 63 | Ht 62.0 in | Wt 130.0 lb

## 2016-10-16 DIAGNOSIS — Z950 Presence of cardiac pacemaker: Secondary | ICD-10-CM | POA: Diagnosis not present

## 2016-10-16 DIAGNOSIS — R55 Syncope and collapse: Secondary | ICD-10-CM | POA: Diagnosis not present

## 2016-10-16 DIAGNOSIS — I4891 Unspecified atrial fibrillation: Secondary | ICD-10-CM

## 2016-10-16 DIAGNOSIS — I10 Essential (primary) hypertension: Secondary | ICD-10-CM | POA: Diagnosis not present

## 2016-10-16 MED ORDER — APIXABAN 2.5 MG PO TABS: 2.5000 mg | ORAL_TABLET | Freq: Two times a day (BID) | ORAL | 3 refills | Status: DC

## 2016-10-16 MED ORDER — APIXABAN 2.5 MG PO TABS: 2.5000 mg | ORAL_TABLET | Freq: Two times a day (BID) | ORAL | 0 refills | Status: DC

## 2016-10-16 NOTE — Patient Instructions (Addendum)
Medication Instructions:   Stop Pradaxa.   Begin Eliquis 2.5mg  twice a day.  Free 30 day trial card given & 90 day sent to [facility].  Continue all other medications.    Labwork:  CBC, BMET - orders given today.   Office will contact with results via phone or letter.    Testing/Procedures: None  Follow-Up: 2 weeks   Any Other Special Instructions Will Be Listed Below (If Applicable). You have been referred to:  Dr. Thompson Grayer for your pacemaker.    If you need a refill on your cardiac medications before your next appointment, please call your pharmacy.

## 2016-10-16 NOTE — Progress Notes (Signed)
CARDIOLOGY CONSULT NOTE  Patient ID: Kaylee Drake MRN: [id number] DOB/AGE: Jan 26, 1932 83 y.o.  Admit date: (Not on file) Primary Physician: Glenda Chroman, MD Referring Physician: Woody Seller  Reason for Consultation: atrial fibrillation, syncope  HPI: Kaylee Drake is a 83 y.o. female who is being seen today for the evaluation of atrial fibrillation and syncope at the request of Dr. Woody Seller.  It appears she has a long history of paroxysmal atrial fibrillation and takes Rythmol and carvedilol. She has been maintained on Pradaxa for anticoagulation.  The history is quite confusing as she has significant memory issues related to Parkinson's disease.  Her primary complaint is passing out. It appears she has had 3 syncopal episodes about 2 weeks ago over the span of 2 weeks. She says these episodes essentially occur without warning. There is no antecedent chest pain, palpitations, or shortness of breath. She says "I have an [place] starting in my body" beforehand.  She said while visiting her husband who was in the hospital with pneumonia in March 2018 at [facility], she passed out. She reports being hospitalized for this. She said she was also diagnosed with pneumonia. I don't have any records at present but I am requesting these.  She denies exertional chest pain. She said she has chronic exertional dyspnea due to COPD from secondhand smoke as her husband used to smoke.  She has a pacemaker which was implanted on 09/07/2010 by Dr. Amanda Pea in [place], [place]. It is St. Jude model [id number], serial number [id number], RV lead 1888TC/58. RA lead 1782TC/46.  She reports that a device interrogation system was mailed to her house but she has yet to unpack this. Her husband sees Dr. Rayann Heman. There is no documentation of this being sent to her in the electronic medical record.  ECG performed in the office today which I ordered an personally interpreted demonstrated sinus rhythm with  first-degree AV block, PR 226 ms, and right bundle-branch block. Rate 63 bpm.   Allergies  Allergen Reactions  . Codeine     REACTION: itching    Current Outpatient Prescriptions  Medication Sig Dispense Refill  . alendronate (FOSAMAX) 70 MG tablet Take 70 mg by mouth once a week. Take with a full glass of water on an empty stomach.    . carbidopa-levodopa (SINEMET IR) 25-100 MG tablet Take 1 tablet by mouth 4 (four) times daily.    . carvedilol (COREG) 12.5 MG tablet Take 12.5 mg by mouth 2 (two) times daily with a meal.    . cetirizine (ZYRTEC) 10 MG tablet Take 10 mg by mouth daily.    . dabigatran (PRADAXA) 150 MG CAPS capsule Take 150 mg by mouth 2 (two) times daily.    Marland Kitchen estrogens, conjugated, (PREMARIN) 0.625 MG tablet Take 0.625 mg by mouth daily. Take daily for 21 days then do not take for 7 days.    Marland Kitchen ipratropium-albuterol (DUONEB) 0.5-2.5 (3) MG/3ML SOLN Take 3 mLs by nebulization.    . isosorbide mononitrate (ISMO,MONOKET) 20 MG tablet Take 20 mg by mouth 2 (two) times daily at 10 AM and 5 PM.    . LORazepam (ATIVAN) 0.5 MG tablet Take 0.5 mg by mouth every 8 (eight) hours.    . nitroGLYCERIN (NITROSTAT) 0.4 MG SL tablet Place 0.4 mg under the tongue every 5 (five) minutes as needed for chest pain.    . pantoprazole (PROTONIX) 40 MG tablet Take 40 mg by mouth daily.    Marland Kitchen  PARoxetine (PAXIL) 20 MG tablet Take 20 mg by mouth daily.    . potassium chloride (K-DUR) 10 MEQ tablet Take 10 mEq by mouth daily.    . pravastatin (PRAVACHOL) 20 MG tablet Take 20 mg by mouth daily.    . propafenone (RYTHMOL) 150 MG tablet Take 150 mg by mouth 2 (two) times daily.    . rasagiline (AZILECT) 1 MG TABS Take 1 mg by mouth daily.    Marland Kitchen TRIMETHOPRIM PO Take by mouth.     No current facility-administered medications for this visit.     Past Medical History:  Diagnosis Date  . Arrhythmia    afib  . Parkinson's disease ([place])   . Stroke [facility])     Past Surgical History:  Procedure  Laterality Date  . CHOLECYSTECTOMY    . COLON SURGERY    . OOPHORECTOMY      Social History   Social History  . Marital status: Married    Spouse name: N/A  . Number of children: N/A  . Years of education: N/A   Occupational History  . Not on file.   Social History Main Topics  . Smoking status: Never Smoker  . Smokeless tobacco: Never Used  . Alcohol use Not on file  . Drug use: Unknown  . Sexual activity: Not on file   Other Topics Concern  . Not on file   Social History Narrative  . No narrative on file     No family history of premature CAD in 1st degree relatives.  Current Meds  Medication Sig  . alendronate (FOSAMAX) 70 MG tablet Take 70 mg by mouth once a week. Take with a full glass of water on an empty stomach.  . carbidopa-levodopa (SINEMET IR) 25-100 MG tablet Take 1 tablet by mouth 4 (four) times daily.  . carvedilol (COREG) 12.5 MG tablet Take 12.5 mg by mouth 2 (two) times daily with a meal.  . cetirizine (ZYRTEC) 10 MG tablet Take 10 mg by mouth daily.  . dabigatran (PRADAXA) 150 MG CAPS capsule Take 150 mg by mouth 2 (two) times daily.  Marland Kitchen estrogens, conjugated, (PREMARIN) 0.625 MG tablet Take 0.625 mg by mouth daily. Take daily for 21 days then do not take for 7 days.  Marland Kitchen ipratropium-albuterol (DUONEB) 0.5-2.5 (3) MG/3ML SOLN Take 3 mLs by nebulization.  . isosorbide mononitrate (ISMO,MONOKET) 20 MG tablet Take 20 mg by mouth 2 (two) times daily at 10 AM and 5 PM.  . LORazepam (ATIVAN) 0.5 MG tablet Take 0.5 mg by mouth every 8 (eight) hours.  . nitroGLYCERIN (NITROSTAT) 0.4 MG SL tablet Place 0.4 mg under the tongue every 5 (five) minutes as needed for chest pain.  . pantoprazole (PROTONIX) 40 MG tablet Take 40 mg by mouth daily.  Marland Kitchen PARoxetine (PAXIL) 20 MG tablet Take 20 mg by mouth daily.  . potassium chloride (K-DUR) 10 MEQ tablet Take 10 mEq by mouth daily.  . pravastatin (PRAVACHOL) 20 MG tablet Take 20 mg by mouth daily.  . propafenone (RYTHMOL)  150 MG tablet Take 150 mg by mouth 2 (two) times daily.  . rasagiline (AZILECT) 1 MG TABS Take 1 mg by mouth daily.  Marland Kitchen TRIMETHOPRIM PO Take by mouth.      Review of systems complete and found to be negative unless listed above in HPI    Physical exam Blood pressure (!) 168/83, pulse 63, height 5\' 2"  (1.575 m), weight 130 lb (59 kg), SpO2 99 %. General: NAD Neck: No JVD,  no thyromegaly or thyroid nodule.  Lungs: Clear to auscultation bilaterally with normal respiratory effort. CV: Nondisplaced PMI. Regular rate and rhythm, normal S1/S2, no S3/S4, no murmur.  No peripheral edema.  No carotid bruit.    Abdomen: Soft, nontender, no distention.  Skin: Intact without lesions or rashes.  Neurologic: Alert and oriented x 3.  Psych: Normal affect. Extremities: No clubbing or cyanosis.  HEENT: Normal.   ECG: Most recent ECG reviewed.   Labs: No results found for: K, BUN, CREATININE, ALT, TSH, HGB   Lipids: No results found for: LDLCALC, LDLDIRECT, CHOL, TRIG, HDL      ASSESSMENT AND PLAN:  1. Syncope: Unclear etiology at present but may be related to her pacemaker. The device has not been interrogated in over 2 years as per patient but her history is very uncertain given her significant memory problems related to Parkinson's disease. I will have her pacemaker interrogated and enroll her in device clinic. I will have her established with Dr. Rayann Heman, EP. I am uncertain if her battery is no longer working. I will also obtain records from [facility] to see if neurologic imaging and an echocardiogram were performed. I will obtain a CBC and BMET.  2. Persistent atrial fibrillation: Appears to be stable at present. Currently in sinus rhythm. I will have her pacemaker interrogated. As Pradaxa can lead to significant bleeding complications in the elderly, I will switch to low-dose Eliquis 2.5 mg twice daily as her weight is 58 kg and she is 83 years old. I will continue carvedilol and Rythmol  150 mg twice daily for now.  3. Hypertension: Elevated. Will monitor.  4. Pacemaker: She has a pacemaker which was implanted on 09/07/2010 by Dr. Amanda Pea in [place], [place]. It is St. Jude model [id number], serial number [id number], RV lead 1888TC/58. RA lead 1782TC/46. I will have her pacemaker interrogated and enroll her in device clinic. I will have her established with Dr. Rayann Heman, EP.   Disposition: Follow up in 2 weeks.  Signed: Kate Sable, M.D., F.A.C.C.  10/16/2016, 9:26 AM

## 2016-10-17 ENCOUNTER — Telehealth: Payer: Self-pay | Admitting: *Deleted

## 2016-10-17 NOTE — Telephone Encounter (Signed)
LMOM requesting call back to the [facility].   Per message from Dr. Court Joy nurse, patient received a monitor in the mail for either her device or her husband's device.  Based on the [facility] info in Dr. Court Joy OV note from 5/29, patient's device is not remote capable.  Will make patient aware when she returns call.

## 2016-10-19 ENCOUNTER — Telehealth: Payer: Self-pay | Admitting: Internal Medicine

## 2016-10-19 NOTE — Telephone Encounter (Signed)
New Message ° ° pt verbalized that she is returning call for rn °

## 2016-10-19 NOTE — Telephone Encounter (Signed)
Follow up  Pt voiced needing to speak with someone in regards to appointment not being able to be made in [place] as a new patient.  Pt voiced she doesn't know what else to do due to lack of transportation for her or husband.  Please f/u

## 2016-10-19 NOTE — Telephone Encounter (Signed)
LMOM- monitor is for husband's PPM. I can explain at her appt with Dr. Rayann Heman 11/23/16 or she can call back.

## 2016-10-19 NOTE — Telephone Encounter (Signed)
Unable to reach patient, will plan to clarify monitor use and purpose at upcoming appointment on 10/24/16.

## 2016-10-19 NOTE — Telephone Encounter (Signed)
error 

## 2016-10-22 NOTE — Telephone Encounter (Signed)
Returned call to patient and left a message for her to return my call.

## 2016-10-24 ENCOUNTER — Encounter: Payer: Medicare Other | Admitting: Internal Medicine

## 2016-10-24 NOTE — Telephone Encounter (Signed)
Discussed with Dr Rayann Heman in regards to patient's transportation issue and he says he will see on 11/09/16 in [place].  Kaylee Drake is going to contact the patient for appointment time.

## 2016-10-25 DIAGNOSIS — Z452 Encounter for adjustment and management of vascular access device: Secondary | ICD-10-CM | POA: Diagnosis not present

## 2016-11-09 ENCOUNTER — Ambulatory Visit (INDEPENDENT_AMBULATORY_CARE_PROVIDER_SITE_OTHER): Payer: Medicare Other | Admitting: Internal Medicine

## 2016-11-09 ENCOUNTER — Encounter: Payer: Self-pay | Admitting: Internal Medicine

## 2016-11-09 VITALS — BP 168/87 | HR 71 | Ht 62.0 in | Wt 135.0 lb

## 2016-11-09 DIAGNOSIS — Z950 Presence of cardiac pacemaker: Secondary | ICD-10-CM | POA: Diagnosis not present

## 2016-11-09 DIAGNOSIS — I4891 Unspecified atrial fibrillation: Secondary | ICD-10-CM

## 2016-11-09 DIAGNOSIS — R001 Bradycardia, unspecified: Secondary | ICD-10-CM | POA: Diagnosis not present

## 2016-11-09 DIAGNOSIS — R55 Syncope and collapse: Secondary | ICD-10-CM | POA: Diagnosis not present

## 2016-11-09 MED ORDER — CARVEDILOL 25 MG PO TABS: 25.0000 mg | ORAL_TABLET | Freq: Two times a day (BID) | ORAL | 3 refills | Status: DC

## 2016-11-09 NOTE — Patient Instructions (Addendum)
Medication Instructions:   Increase Coreg to 25mg  twice a day.  May take 2 tabs of your 12.5mg  tablet twice a day till finish current supply.  New 90 day sent to mail order for the 25mg  tablet.  Stop Rhythmol (Propafenone).   Continue all other medications.    Labwork: none  Testing/Procedures: none  Follow-Up: Your physician wants you to follow up in:  1 year.  You will receive a reminder letter in the mail one-two months in advance.  If you don't receive a letter, please call our office to schedule the follow up appointment.  (Dr. Rayann Heman)    Any Other Special Instructions Will Be Listed Below (If Applicable). Your physician wants you to follow up in: 6 months.  You will receive a reminder letter in the mail one-two months in advance.  If you don't receive a letter, please call our office to schedule the follow up appointment.  (device clinic)   If you need a refill on your cardiac medications before your next appointment, please call your pharmacy.    >>>>   NO DRIVING X 6 MONTHS  <<<<<<

## 2016-11-09 NOTE — Progress Notes (Signed)
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 female with a h/o bradycardia sp PPM (SJM) by Dr Sharyon Cable who presents today to establish care in the Electrophysiology device clinic.   The patient reports doing very well since having a pacemaker implanted and remains very active despite her age.   She did have several episodes of syncope recently of unknown cause.  She presented to [facility] and had a workup which was unrevealing.  She has not had any further episodes.  Today, she  denies symptoms of palpitations, chest pain, shortness of breath, orthopnea, PND, lower extremity edema, dizziness  or neurologic sequela.  The patientis tolerating medications without difficulties and is otherwise without complaint today.   Past Medical History:  Diagnosis Date  . Parkinson's disease ([place])   . Paroxysmal atrial fibrillation ([facility])    afib  . Stroke [facility])    Past Surgical History:  Procedure Laterality Date  . CHOLECYSTECTOMY    . COLON SURGERY    . OOPHORECTOMY    . PACEMAKER IMPLANT  09/07/2010   St Jude [address] DR implanted by Dr Sharyon Cable    Social History   Social History  . Marital status: Married    Spouse name: N/A  . Number of children: N/A  . Years of education: N/A   Occupational History  . Not on file.   Social History Main Topics  . Smoking status: Never Smoker  . Smokeless tobacco: Never Used  . Alcohol use No  . Drug use: No  . Sexual activity: Not on file   Other Topics Concern  . Not on file   Social History Narrative  . No narrative on file    History reviewed. No pertinent family history.  Allergies  Allergen Reactions  . Codeine     REACTION: itching    Current Outpatient Prescriptions  Medication Sig Dispense Refill  . alendronate (FOSAMAX) 70 MG tablet Take 70 mg by mouth once a week. Take with a full glass of water on an empty stomach.    Marland Kitchen apixaban (ELIQUIS) 2.5 MG TABS tablet Take 1 tablet (2.5 mg  total) by mouth 2 (two) times daily. 180 tablet 3  . carbidopa-levodopa (SINEMET IR) 25-100 MG tablet Take 1 tablet by mouth 4 (four) times daily.    . carvedilol (COREG) 25 MG tablet Take 1 tablet (25 mg total) by mouth 2 (two) times daily. 90 tablet 3  . cetirizine (ZYRTEC) 10 MG tablet Take 10 mg by mouth daily.    Marland Kitchen estrogens, conjugated, (PREMARIN) 0.625 MG tablet Take 0.625 mg by mouth daily. Take daily for 21 days then do not take for 7 days.    Marland Kitchen ipratropium-albuterol (DUONEB) 0.5-2.5 (3) MG/3ML SOLN Take 3 mLs by nebulization as directed.     . isosorbide mononitrate (ISMO,MONOKET) 20 MG tablet Take 20 mg by mouth 2 (two) times daily at 10 AM and 5 PM.    . LORazepam (ATIVAN) 0.5 MG tablet Take 0.5 mg by mouth every 8 (eight) hours.    . nitroGLYCERIN (NITROSTAT) 0.4 MG SL tablet Place 0.4 mg under the tongue every 5 (five) minutes as needed for chest pain (MAX 3 TABLETS IN 15 MINS).     Marland Kitchen pantoprazole (PROTONIX) 40 MG tablet Take 40 mg by mouth daily.    Marland Kitchen PARoxetine (PAXIL) 20 MG tablet Take 20 mg by mouth daily.    . potassium chloride (K-DUR) 10 MEQ tablet Take  10 mEq by mouth daily.    . pravastatin (PRAVACHOL) 20 MG tablet Take 20 mg by mouth daily.    . rasagiline (AZILECT) 1 MG TABS Take 1 mg by mouth daily.    Marland Kitchen TRIMETHOPRIM PO Take by mouth as directed.      No current facility-administered medications for this visit.     ROS- all systems are reviewed and negative except as per HPI  Physical Exam: Vitals:   11/09/16 1524  BP: (!) 168/87  Pulse: 71  SpO2: 97%  Weight: 135 lb (61.2 kg)  Height: 5\' 2"  (1.575 m)    GEN- The patient is ellderly appearing, alert and oriented x 3 today.   Head- normocephalic, atraumatic Eyes-  Sclera clear, conjunctiva pink Ears- hearing intact Oropharynx- clear Neck- supple,   Lungs- Clear to ausculation bilaterally, normal work of breathing Chest- pacemaker pocket is well healed Heart- Regular rate and rhythm, no murmurs, rubs or  gallops, PMI not laterally displaced GI- soft, NT, ND, + BS Extremities- no clubbing, cyanosis, or edema MS- no significant deformity or atrophy Skin- no rash or lesion Psych- euthymic mood, full affect Neuro- strength and sensation are intact  Pacemaker interrogation- reviewed in detail today,  See PACEART report  ekg reveals sinus rhythm, PR 234 msec, RBBB  Assessment and Plan:   1. Syncope Unclear etiology No abnormal findings on PPM interrogation Normal pacemaker function Possibly due to hypotension,  Consider reducing nitrates if episodes recur Adequate hydration and heat avoidance encouraged No driving x 6 months  2. afib Well controlled Currently on eliquis for stroke prevention (chads2vasc score is at least 5) No changes  3. Sinus bradycardia Normal PPM function TTMs Return to see device nurse in 6 months I will see in a year unless problems arise  Thompson Grayer MD, [facility]

## 2016-11-12 ENCOUNTER — Encounter: Payer: Self-pay | Admitting: Cardiovascular Disease

## 2016-11-12 ENCOUNTER — Ambulatory Visit (INDEPENDENT_AMBULATORY_CARE_PROVIDER_SITE_OTHER): Payer: Medicare Other | Admitting: Cardiovascular Disease

## 2016-11-12 VITALS — BP 125/69 | HR 69 | Ht 62.0 in | Wt 133.0 lb

## 2016-11-12 DIAGNOSIS — F411 Generalized anxiety disorder: Secondary | ICD-10-CM

## 2016-11-12 DIAGNOSIS — I10 Essential (primary) hypertension: Secondary | ICD-10-CM

## 2016-11-12 DIAGNOSIS — I4891 Unspecified atrial fibrillation: Secondary | ICD-10-CM | POA: Diagnosis not present

## 2016-11-12 DIAGNOSIS — Z950 Presence of cardiac pacemaker: Secondary | ICD-10-CM | POA: Diagnosis not present

## 2016-11-12 DIAGNOSIS — R55 Syncope and collapse: Secondary | ICD-10-CM | POA: Diagnosis not present

## 2016-11-12 MED ORDER — AMLODIPINE BESYLATE 5 MG PO TABS: 5.0000 mg | ORAL_TABLET | Freq: Every day | ORAL | 6 refills | Status: DC

## 2016-11-12 NOTE — Progress Notes (Signed)
SUBJECTIVE: The patient presents for follow-up of atrial fibrillation, pacemaker, and syncope.  Echocardiogram in March 2018 demonstrated normal left ventricular systolic function, grade 2 diastolic dysfunction, mild mitral, mild to moderate tricuspid, mild pulmonic, and mild aortic regurgitation with mild anterior mitral leaflet prolapse.  She has a lot of stress at home and taking care of her husband. Her husband has anxiety and anger outbursts. She wants to continue to take care of him at home.   Her blood pressures are significantly elevated at home. Carvedilol was recently increased by Dr. Rayann Heman to 25 mg twice daily. Blood pressure 179/89 on 6/24, 188/94 on 6/25.  She takes Paxil 20 mg daily and Xanax as needed. She has headaches. She is on isosorbide mononitrate.   Review of Systems: As per "subjective", otherwise negative.  Allergies  Allergen Reactions  . Codeine     REACTION: itching    Current Outpatient Prescriptions  Medication Sig Dispense Refill  . alendronate (FOSAMAX) 70 MG tablet Take 70 mg by mouth once a week. Take with a full glass of water on an empty stomach.    Marland Kitchen apixaban (ELIQUIS) 2.5 MG TABS tablet Take 1 tablet (2.5 mg total) by mouth 2 (two) times daily. 180 tablet 3  . carbidopa-levodopa (SINEMET IR) 25-100 MG tablet Take 1 tablet by mouth 4 (four) times daily.    . carvedilol (COREG) 25 MG tablet Take 1 tablet (25 mg total) by mouth 2 (two) times daily. 90 tablet 3  . cetirizine (ZYRTEC) 10 MG tablet Take 10 mg by mouth daily.    Marland Kitchen estrogens, conjugated, (PREMARIN) 0.625 MG tablet Take 0.625 mg by mouth daily. Take daily for 21 days then do not take for 7 days.    Marland Kitchen ipratropium-albuterol (DUONEB) 0.5-2.5 (3) MG/3ML SOLN Take 3 mLs by nebulization as directed.     . isosorbide mononitrate (ISMO,MONOKET) 20 MG tablet Take 20 mg by mouth 2 (two) times daily at 10 AM and 5 PM.    . LORazepam (ATIVAN) 0.5 MG tablet Take 0.5 mg by mouth every 8 (eight)  hours.    . nitroGLYCERIN (NITROSTAT) 0.4 MG SL tablet Place 0.4 mg under the tongue every 5 (five) minutes as needed for chest pain (MAX 3 TABLETS IN 15 MINS).     Marland Kitchen pantoprazole (PROTONIX) 40 MG tablet Take 40 mg by mouth daily.    Marland Kitchen PARoxetine (PAXIL) 20 MG tablet Take 20 mg by mouth daily.    . potassium chloride (K-DUR) 10 MEQ tablet Take 10 mEq by mouth daily.    . pravastatin (PRAVACHOL) 20 MG tablet Take 20 mg by mouth daily.    . rasagiline (AZILECT) 1 MG TABS Take 1 mg by mouth daily.    Marland Kitchen TRIMETHOPRIM PO Take by mouth as directed.      No current facility-administered medications for this visit.     Past Medical History:  Diagnosis Date  . Parkinson's disease ([place])   . Paroxysmal atrial fibrillation ([facility])    afib  . Stroke [facility])     Past Surgical History:  Procedure Laterality Date  . CHOLECYSTECTOMY    . COLON SURGERY    . OOPHORECTOMY    . PACEMAKER IMPLANT  09/07/2010   St Jude [address] implanted by Dr Sharyon Cable    Social History   Social History  . Marital status: Married    Spouse name: N/A  . Number of children: N/A  . Years of education: N/A   Occupational  History  . Not on file.   Social History Main Topics  . Smoking status: Never Smoker  . Smokeless tobacco: Never Used  . Alcohol use No  . Drug use: No  . Sexual activity: Not on file   Other Topics Concern  . Not on file   Social History Narrative  . No narrative on file     Vitals:   11/12/16 1023  BP: 125/69  Pulse: 69  SpO2: 96%  Weight: 133 lb (60.3 kg)  Height: 5\' 2"  (1.575 m)    Wt Readings from Last 3 Encounters:  11/12/16 133 lb (60.3 kg)  11/09/16 135 lb (61.2 kg)  10/16/16 130 lb (59 kg)     PHYSICAL EXAM General: NAD HEENT: Normal. Neck: No JVD, no thyromegaly. Lungs: Clear to auscultation bilaterally with normal respiratory effort. CV: Nondisplaced PMI.  Regular rate and rhythm, normal S1/S2, no S3/S4, no murmur. No pretibial or periankle edema.  No carotid  bruit.   Abdomen: Soft, nontender, no distention.  Neurologic: Alert and oriented.  Psych: Normal affect. Skin: Normal. Musculoskeletal: No gross deformities.    ECG: Most recent ECG reviewed.   Labs: No results found for: K, BUN, CREATININE, ALT, TSH, HGB   Lipids: No results found for: LDLCALC, LDLDIRECT, CHOL, TRIG, HDL     ASSESSMENT AND PLAN:  1. Syncope: No recurrences. Unclear etiology. Instructed not to drive. Will monitor.  2. Persistent atrial fibrillation: Symptomatically stable. Continue carvedilol, Rythmol, and low-dose Eliquis.  3. Hypertension: Complains of headaches. Blood pressure is significantly elevated at home and appears to be due to situational stress and anxiety. I will stop isosorbide mononitrate and start amlodipine 5 mg daily.  4. Pacemaker: Pacemaker recently interrogated and demonstrated normal function. There were no findings to explain syncope.  5. Anxiety and stress: She has situational anxiety and stress related to taking care of her husband at home. I will make a behavioral therapy referral. She is currently on Paxil 20 mg daily. This may need to be changed to an alternative medication. This is certainly contributing to her hypertension.  Disposition: Follow up 3 months  Kate Sable, M.D., F.A.C.C.

## 2016-11-12 NOTE — Patient Instructions (Addendum)
Medication Instructions:   Stop Isosorbide.  Begin Norvasc 5mg  daily.  Continue all other medications.    Labwork: none  Testing/Procedures: none  Follow-Up: 3 months   Any Other Special Instructions Will Be Listed Below (If Applicable). You have been referred to:  Behavioral Health at [facility]   If you need a refill on your cardiac medications before your next appointment, please call your pharmacy.

## 2016-11-13 LAB — CUP PACEART INCLINIC DEVICE CHECK
Date Time Interrogation Session: 20180622193225
Implantable Lead Implant Date: 20120419
Implantable Lead Implant Date: 20120419
Implantable Lead Location: 753859
Implantable Lead Location: 753860
Implantable Pulse Generator Implant Date: 20120419
Lead Channel Impedance Value: 268 Ohm
Lead Channel Impedance Value: 315 Ohm
Lead Channel Pacing Threshold Amplitude: 0.75 V
Lead Channel Pacing Threshold Pulse Width: 0.4 ms
Lead Channel Sensing Intrinsic Amplitude: 10.5 mV
Lead Channel Setting Sensing Sensitivity: 2 mV
MDC IDC MSMT BATTERY IMPEDANCE: 1000 Ohm — AB
MDC IDC MSMT BATTERY VOLTAGE: 2.79 V
MDC IDC MSMT LEADCHNL RA PACING THRESHOLD AMPLITUDE: 0.75 V
MDC IDC MSMT LEADCHNL RA PACING THRESHOLD PULSEWIDTH: 0.4 ms
MDC IDC MSMT LEADCHNL RA SENSING INTR AMPL: 2.3 mV
MDC IDC SET LEADCHNL RA PACING AMPLITUDE: 2 V
MDC IDC SET LEADCHNL RV PACING PULSEWIDTH: 0.4 ms
MDC IDC STAT BRADY RA PERCENT PACED: 6.6 %
MDC IDC STAT BRADY RV PERCENT PACED: 3.1 %
Pulse Gen Model: 5826
Pulse Gen Serial Number: 2597636

## 2016-11-22 DIAGNOSIS — Z452 Encounter for adjustment and management of vascular access device: Secondary | ICD-10-CM | POA: Diagnosis not present

## 2016-11-26 DIAGNOSIS — I509 Heart failure, unspecified: Secondary | ICD-10-CM | POA: Diagnosis not present

## 2016-11-26 DIAGNOSIS — Z713 Dietary counseling and surveillance: Secondary | ICD-10-CM | POA: Diagnosis not present

## 2016-11-26 DIAGNOSIS — J449 Chronic obstructive pulmonary disease, unspecified: Secondary | ICD-10-CM | POA: Diagnosis not present

## 2016-11-26 DIAGNOSIS — G20 Parkinson's disease: Secondary | ICD-10-CM | POA: Diagnosis not present

## 2016-11-26 DIAGNOSIS — Z299 Encounter for prophylactic measures, unspecified: Secondary | ICD-10-CM | POA: Diagnosis not present

## 2016-11-26 DIAGNOSIS — I4891 Unspecified atrial fibrillation: Secondary | ICD-10-CM | POA: Diagnosis not present

## 2016-11-26 DIAGNOSIS — I10 Essential (primary) hypertension: Secondary | ICD-10-CM | POA: Diagnosis not present

## 2016-11-26 DIAGNOSIS — Z6820 Body mass index (BMI) 20.0-20.9, adult: Secondary | ICD-10-CM | POA: Diagnosis not present

## 2016-11-30 DIAGNOSIS — I10 Essential (primary) hypertension: Secondary | ICD-10-CM | POA: Diagnosis not present

## 2016-11-30 DIAGNOSIS — Z299 Encounter for prophylactic measures, unspecified: Secondary | ICD-10-CM | POA: Diagnosis not present

## 2016-11-30 DIAGNOSIS — J449 Chronic obstructive pulmonary disease, unspecified: Secondary | ICD-10-CM | POA: Diagnosis not present

## 2016-11-30 DIAGNOSIS — I4891 Unspecified atrial fibrillation: Secondary | ICD-10-CM | POA: Diagnosis not present

## 2016-11-30 DIAGNOSIS — F419 Anxiety disorder, unspecified: Secondary | ICD-10-CM | POA: Diagnosis not present

## 2016-11-30 DIAGNOSIS — Z789 Other specified health status: Secondary | ICD-10-CM | POA: Diagnosis not present

## 2016-11-30 DIAGNOSIS — Z6822 Body mass index (BMI) 22.0-22.9, adult: Secondary | ICD-10-CM | POA: Diagnosis not present

## 2016-11-30 DIAGNOSIS — I509 Heart failure, unspecified: Secondary | ICD-10-CM | POA: Diagnosis not present

## 2016-11-30 DIAGNOSIS — Z713 Dietary counseling and surveillance: Secondary | ICD-10-CM | POA: Diagnosis not present

## 2016-11-30 NOTE — Progress Notes (Deleted)
Psychiatric Initial Adult Assessment   Patient Identification: Kaylee Drake MRN:  [id number] Date of Evaluation:  11/30/2016 Referral Source: *** Chief Complaint:   Visit Diagnosis: No diagnosis found.  History of Present Illness:   Kaylee Drake is a 83 year old female with anxiety, Atrial fibrillation, s/p pacemaker, hypertension, who is referred for anxiety.    Associated Signs/Symptoms: Depression Symptoms:  {DEPRESSION SYMPTOMS:20000} (Hypo) Manic Symptoms:  {BHH MANIC SYMPTOMS:22872} Anxiety Symptoms:  {BHH ANXIETY SYMPTOMS:22873} Psychotic Symptoms:  {BHH PSYCHOTIC SYMPTOMS:22874} PTSD Symptoms: {BHH PTSD SYMPTOMS:22875}  Past Psychiatric History:  Outpatient:  Psychiatry admission:  Previous suicide attempt:  Past trials of medication:  History of violence:   Previous Psychotropic Medications: {YES/NO:21197}  Substance Abuse History in the last 12 months:  {yes no:314532}  Consequences of Substance Abuse: {BHH CONSEQUENCES OF SUBSTANCE ABUSE:22880}  Past Medical History:  Past Medical History:  Diagnosis Date  . Parkinson's disease ([place])   . Paroxysmal atrial fibrillation (HCC)    afib  . Stroke [facility])     Past Surgical History:  Procedure Laterality Date  . CHOLECYSTECTOMY    . COLON SURGERY    . OOPHORECTOMY    . PACEMAKER IMPLANT  09/07/2010   St Jude [place] DR implanted by Dr Sharyon Cable    Family Psychiatric History: ***  Family History: No family history on file.  Social History:   Social History   Social History  . Marital status: Married    Spouse name: N/A  . Number of children: N/A  . Years of education: N/A   Social History Main Topics  . Smoking status: Never Smoker  . Smokeless tobacco: Never Used  . Alcohol use No  . Drug use: No  . Sexual activity: Not on file   Other Topics Concern  . Not on file   Social History Narrative  . No narrative on file    Additional Social History: ***  Allergies:   Allergies  Allergen  Reactions  . Codeine     REACTION: itching    Metabolic Disorder Labs: No results found for: HGBA1C, MPG No results found for: PROLACTIN No results found for: CHOL, TRIG, HDL, CHOLHDL, VLDL, LDLCALC   Current Medications: Current Outpatient Prescriptions  Medication Sig Dispense Refill  . alendronate (FOSAMAX) 70 MG tablet Take 70 mg by mouth once a week. Take with a full glass of water on an empty stomach.    Marland Kitchen amLODipine (NORVASC) 5 MG tablet Take 1 tablet (5 mg total) by mouth daily. 30 tablet 6  . apixaban (ELIQUIS) 2.5 MG TABS tablet Take 1 tablet (2.5 mg total) by mouth 2 (two) times daily. 180 tablet 3  . carbidopa-levodopa (SINEMET IR) 25-100 MG tablet Take 1 tablet by mouth 4 (four) times daily.    . carvedilol (COREG) 25 MG tablet Take 1 tablet (25 mg total) by mouth 2 (two) times daily. 90 tablet 3  . cetirizine (ZYRTEC) 10 MG tablet Take 10 mg by mouth daily.    Marland Kitchen estrogens, conjugated, (PREMARIN) 0.625 MG tablet Take 0.625 mg by mouth daily. Take daily for 21 days then do not take for 7 days.    Marland Kitchen ipratropium-albuterol (DUONEB) 0.5-2.5 (3) MG/3ML SOLN Take 3 mLs by nebulization as directed.     Marland Kitchen LORazepam (ATIVAN) 0.5 MG tablet Take 0.5 mg by mouth every 8 (eight) hours.    . nitroGLYCERIN (NITROSTAT) 0.4 MG SL tablet Place 0.4 mg under the tongue every 5 (five) minutes as needed for chest pain (MAX  3 TABLETS IN 15 MINS).     Marland Kitchen pantoprazole (PROTONIX) 40 MG tablet Take 40 mg by mouth daily.    Marland Kitchen PARoxetine (PAXIL) 20 MG tablet Take 20 mg by mouth daily.    . potassium chloride (K-DUR) 10 MEQ tablet Take 10 mEq by mouth daily.    . pravastatin (PRAVACHOL) 20 MG tablet Take 20 mg by mouth daily.    . rasagiline (AZILECT) 1 MG TABS Take 1 mg by mouth daily.    Marland Kitchen TRIMETHOPRIM PO Take by mouth as directed.      No current facility-administered medications for this visit.     Neurologic: Headache: No Seizure: No Paresthesias:No  Musculoskeletal: Strength & Muscle Tone:  within normal limits Gait & Station: normal Patient leans: N/A  Psychiatric Specialty Exam: ROS  There were no vitals taken for this visit.There is no height or weight on file to calculate BMI.  General Appearance: Fairly Groomed  Eye Contact:  Good  Speech:  Clear and Coherent  Volume:  Normal  Mood:  {BHH MOOD:22306}  Affect:  {Affect (PAA):22687}  Thought Process:  Coherent and Goal Directed  Orientation:  Full (Time, Place, and Person)  Thought Content:  Logical  Suicidal Thoughts:  {ST/HT (PAA):22692}  Homicidal Thoughts:  {ST/HT (PAA):22692}  Memory:  Immediate;   Good Recent;   Good Remote;   Good  Judgement:  Good  Insight:  {Insight (PAA):22695}  Psychomotor Activity:  Normal  Concentration:  Concentration: Good and Attention Span: Good  Recall:  Good  Fund of Knowledge:Good  Language: Good  Akathisia:  No  Handed:  Right  AIMS (if indicated):  N/A  Assets:  Communication Skills Desire for Improvement  ADL's:  Intact  Cognition: WNL  Sleep:  ***   Assessment  Plan  The patient demonstrates the following risk factors for suicide: Chronic risk factors for suicide include: {Chronic Risk Factors for NUUVOZD:66440347}. Acute risk factors for suicide include: {Acute Risk Factors for QQVZDGL:87564332}. Protective factors for this patient include: {Protective Factors for Suicide RJJO:84166063}. Considering these factors, the overall suicide risk at this point appears to be {Desc; low/moderate/high:110033}. Patient {ACTION; IS/IS KZS:01093235} appropriate for outpatient follow up.   Treatment Plan Summary: {CHL AMB [facility] MD [place] TDDU:2025427062}   [name], MD 7/13/201811:26 AM

## 2016-12-04 ENCOUNTER — Encounter (HOSPITAL_COMMUNITY): Payer: Self-pay | Admitting: Psychiatry

## 2016-12-04 ENCOUNTER — Ambulatory Visit (INDEPENDENT_AMBULATORY_CARE_PROVIDER_SITE_OTHER): Payer: Medicare Other | Admitting: Psychiatry

## 2016-12-04 VITALS — BP 99/59 | HR 70 | Ht 62.0 in | Wt 134.8 lb

## 2016-12-04 DIAGNOSIS — I4891 Unspecified atrial fibrillation: Secondary | ICD-10-CM | POA: Diagnosis not present

## 2016-12-04 DIAGNOSIS — Z950 Presence of cardiac pacemaker: Secondary | ICD-10-CM | POA: Diagnosis not present

## 2016-12-04 DIAGNOSIS — F419 Anxiety disorder, unspecified: Secondary | ICD-10-CM

## 2016-12-04 DIAGNOSIS — F331 Major depressive disorder, recurrent, moderate: Secondary | ICD-10-CM | POA: Insufficient documentation

## 2016-12-04 DIAGNOSIS — G4700 Insomnia, unspecified: Secondary | ICD-10-CM

## 2016-12-04 DIAGNOSIS — I10 Essential (primary) hypertension: Secondary | ICD-10-CM | POA: Diagnosis not present

## 2016-12-04 DIAGNOSIS — R45851 Suicidal ideations: Secondary | ICD-10-CM

## 2016-12-04 DIAGNOSIS — Z8673 Personal history of transient ischemic attack (TIA), and cerebral infarction without residual deficits: Secondary | ICD-10-CM

## 2016-12-04 DIAGNOSIS — G20 Parkinson's disease: Secondary | ICD-10-CM

## 2016-12-04 MED ORDER — SERTRALINE HCL 50 MG PO TABS: ORAL_TABLET | ORAL | 0 refills | Status: DC

## 2016-12-04 MED ORDER — SERTRALINE HCL 50 MG PO TABS: ORAL_TABLET | ORAL | 1 refills | Status: DC

## 2016-12-04 NOTE — Patient Instructions (Signed)
1. Decrease Paxil 10 mg daily for one week, then discontinue 2. Start sertraline 25 mg daily for one week, then 50 mg daily 3. Return to clinic in one month for 30 mins 4. Consider contacting your primary care clinic and asks if they have social worker in [place]
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
## 2016-12-05 ENCOUNTER — Ambulatory Visit (HOSPITAL_COMMUNITY): Payer: Medicare Other | Admitting: Psychiatry

## 2016-12-14 ENCOUNTER — Telehealth: Payer: Self-pay | Admitting: Cardiovascular Disease

## 2016-12-14 NOTE — Telephone Encounter (Signed)
Spoke with patient while she was out eating lunch and she said that on yesterday she went to sign papers for her husband at the [facility] and began to feel strange so she sat down and had her BP checked by staff at the [facility]hab [facility] and her BP was 70/40. Patient said she sat there for a while before leaving to go home but she still felt sort of weak when she left. Patient said she drove herself home. Patient did mention that she had recent changes to her BP and heart medications due to her BP being elevated when she came to the office. Patient say's her coreg was doubled at her last [facility] visit. After review of her chart, patient was started on norvasc 5 mg daily by Dr. Bronson Ing. Patient decreased her carvedilol dose this morning and  took carvedilol 12.5 mg. Patient said today she feels better than she did when she was at the Rehab Dept. Nurse requested that patient take her BP while on the phone with nurse but due to patient not being at home, she was unable to check her BP. Patient advised to continue monitoring her BP at least daily using the same cuff, same arm and at the same time preferably one hour after she takes her medications. Patient advised to contact our office in one week with her BP readings. Patient also advised to continue taking the carvedilol 12.5 mg twice daily until further notice. Patient advised to change positions slowly. Patient advised that if her symptoms return or get worse that she needed to go to the ED for an evaluation. Patient verbalized understanding of plan.

## 2016-12-14 NOTE — Telephone Encounter (Signed)
Patient has had low BP.  Started yesterday   BP was 70/40 while she was at her husband's rehab appointment. They took her BP there. She was feeling shaky and dizzy when her BP was this low. Currently not feeling this way but stated that something isn't right with her medication.  Would like to know if recent change could be causes this issue

## 2016-12-17 NOTE — Telephone Encounter (Signed)
I agree with your assessment and plan  J Branch MD 

## 2016-12-20 DIAGNOSIS — Z452 Encounter for adjustment and management of vascular access device: Secondary | ICD-10-CM | POA: Diagnosis not present

## 2016-12-24 ENCOUNTER — Telehealth (HOSPITAL_COMMUNITY): Payer: Self-pay | Admitting: *Deleted

## 2016-12-24 NOTE — Telephone Encounter (Signed)
Pt pharmacy [facility] requesting refills with 90 days supply for pt Sertraline HCL. Pt medication was last filled on 12-04-2016 with 30 tabs 0 refill and it was sent to [facility]. [facility] number is [phone] and fax number is [phone]. Pt f/u appt is 01-01-2017.

## 2016-12-25 NOTE — Telephone Encounter (Signed)
Please ask the patient if she would like to take for 90 days (we might change the dose soon if she does not have any side effect)
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
## 2017-01-01 ENCOUNTER — Ambulatory Visit (INDEPENDENT_AMBULATORY_CARE_PROVIDER_SITE_OTHER): Payer: Medicare Other | Admitting: Psychiatry

## 2017-01-01 ENCOUNTER — Encounter (HOSPITAL_COMMUNITY): Payer: Self-pay | Admitting: Psychiatry

## 2017-01-01 VITALS — BP 115/58 | HR 64 | Ht 62.0 in | Wt 134.8 lb

## 2017-01-01 DIAGNOSIS — F419 Anxiety disorder, unspecified: Secondary | ICD-10-CM

## 2017-01-01 DIAGNOSIS — R413 Other amnesia: Secondary | ICD-10-CM | POA: Diagnosis not present

## 2017-01-01 DIAGNOSIS — F331 Major depressive disorder, recurrent, moderate: Secondary | ICD-10-CM | POA: Diagnosis not present

## 2017-01-01 DIAGNOSIS — G4700 Insomnia, unspecified: Secondary | ICD-10-CM | POA: Diagnosis not present

## 2017-01-01 DIAGNOSIS — Z950 Presence of cardiac pacemaker: Secondary | ICD-10-CM | POA: Diagnosis not present

## 2017-01-01 MED ORDER — SERTRALINE HCL 100 MG PO TABS: 100.0000 mg | ORAL_TABLET | Freq: Every day | ORAL | 0 refills | Status: DC

## 2017-01-01 NOTE — Patient Instructions (Signed)
1. Increase sertraline 100 mg daily 2. Consider talking to a Education officer, museum at the facility and asks about options after your husband discharge (i.e. Nursing facitliy) 3. Return to clinic in one month for 30 mins 4. Obtain record from your PCP

## 2017-01-04 ENCOUNTER — Other Ambulatory Visit: Payer: Self-pay

## 2017-01-04 MED ORDER — AMLODIPINE BESYLATE 5 MG PO TABS: 5.0000 mg | ORAL_TABLET | Freq: Every day | ORAL | 0 refills | Status: DC

## 2017-01-08 NOTE — Telephone Encounter (Signed)
Called pt and lmtcb due to trying to know if she wants office to send her script to mail order pharmacy or keep it with local pharmacy. Also trying to see if pt wants office to refill for 90 days at a time with her pharmacy or keep it at 30 days at a time. Office number was provided on pt voicemail.

## 2017-01-09 DIAGNOSIS — F419 Anxiety disorder, unspecified: Secondary | ICD-10-CM | POA: Diagnosis not present

## 2017-01-09 DIAGNOSIS — F329 Major depressive disorder, single episode, unspecified: Secondary | ICD-10-CM | POA: Diagnosis not present

## 2017-01-09 DIAGNOSIS — Z7189 Other specified counseling: Secondary | ICD-10-CM | POA: Diagnosis not present

## 2017-01-09 DIAGNOSIS — Z0000 Encounter for general adult medical examination without abnormal findings: Secondary | ICD-10-CM | POA: Diagnosis not present

## 2017-01-09 DIAGNOSIS — Z6822 Body mass index (BMI) 22.0-22.9, adult: Secondary | ICD-10-CM | POA: Diagnosis not present

## 2017-01-09 DIAGNOSIS — I4891 Unspecified atrial fibrillation: Secondary | ICD-10-CM | POA: Diagnosis not present

## 2017-01-09 DIAGNOSIS — J449 Chronic obstructive pulmonary disease, unspecified: Secondary | ICD-10-CM | POA: Diagnosis not present

## 2017-01-09 DIAGNOSIS — Z1389 Encounter for screening for other disorder: Secondary | ICD-10-CM | POA: Diagnosis not present

## 2017-01-09 DIAGNOSIS — Z299 Encounter for prophylactic measures, unspecified: Secondary | ICD-10-CM | POA: Diagnosis not present

## 2017-01-09 DIAGNOSIS — I509 Heart failure, unspecified: Secondary | ICD-10-CM | POA: Diagnosis not present

## 2017-01-09 DIAGNOSIS — I10 Essential (primary) hypertension: Secondary | ICD-10-CM | POA: Diagnosis not present

## 2017-01-09 DIAGNOSIS — G20 Parkinson's disease: Secondary | ICD-10-CM | POA: Diagnosis not present

## 2017-01-11 NOTE — Telephone Encounter (Signed)
Called pt due to previous messages and was unable to reach pt. Staff ltcb and office number provided.

## 2017-01-17 DIAGNOSIS — W1839XA Other fall on same level, initial encounter: Secondary | ICD-10-CM | POA: Diagnosis not present

## 2017-01-17 DIAGNOSIS — S3993XA Unspecified injury of pelvis, initial encounter: Secondary | ICD-10-CM | POA: Diagnosis not present

## 2017-01-17 DIAGNOSIS — G20 Parkinson's disease: Secondary | ICD-10-CM | POA: Diagnosis not present

## 2017-01-17 DIAGNOSIS — Z79899 Other long term (current) drug therapy: Secondary | ICD-10-CM | POA: Diagnosis not present

## 2017-01-17 DIAGNOSIS — S82002A Unspecified fracture of left patella, initial encounter for closed fracture: Secondary | ICD-10-CM | POA: Diagnosis not present

## 2017-01-17 DIAGNOSIS — M25551 Pain in right hip: Secondary | ICD-10-CM | POA: Diagnosis not present

## 2017-01-17 DIAGNOSIS — Z7901 Long term (current) use of anticoagulants: Secondary | ICD-10-CM | POA: Diagnosis not present

## 2017-01-17 DIAGNOSIS — S82001A Unspecified fracture of right patella, initial encounter for closed fracture: Secondary | ICD-10-CM | POA: Diagnosis not present

## 2017-01-17 DIAGNOSIS — Z96649 Presence of unspecified artificial hip joint: Secondary | ICD-10-CM | POA: Diagnosis not present

## 2017-01-17 DIAGNOSIS — I4891 Unspecified atrial fibrillation: Secondary | ICD-10-CM | POA: Diagnosis not present

## 2017-01-17 DIAGNOSIS — M25561 Pain in right knee: Secondary | ICD-10-CM | POA: Diagnosis not present

## 2017-01-22 DIAGNOSIS — Z6822 Body mass index (BMI) 22.0-22.9, adult: Secondary | ICD-10-CM | POA: Diagnosis not present

## 2017-01-22 DIAGNOSIS — J449 Chronic obstructive pulmonary disease, unspecified: Secondary | ICD-10-CM | POA: Diagnosis not present

## 2017-01-22 DIAGNOSIS — M25561 Pain in right knee: Secondary | ICD-10-CM | POA: Diagnosis not present

## 2017-01-22 DIAGNOSIS — I4891 Unspecified atrial fibrillation: Secondary | ICD-10-CM | POA: Diagnosis not present

## 2017-01-22 DIAGNOSIS — W19XXXA Unspecified fall, initial encounter: Secondary | ICD-10-CM | POA: Diagnosis not present

## 2017-01-22 DIAGNOSIS — Z299 Encounter for prophylactic measures, unspecified: Secondary | ICD-10-CM | POA: Diagnosis not present

## 2017-01-22 DIAGNOSIS — I509 Heart failure, unspecified: Secondary | ICD-10-CM | POA: Diagnosis not present

## 2017-01-22 DIAGNOSIS — G20 Parkinson's disease: Secondary | ICD-10-CM | POA: Diagnosis not present

## 2017-01-22 DIAGNOSIS — Z713 Dietary counseling and surveillance: Secondary | ICD-10-CM | POA: Diagnosis not present

## 2017-01-23 DIAGNOSIS — E785 Hyperlipidemia, unspecified: Secondary | ICD-10-CM | POA: Diagnosis not present

## 2017-01-23 DIAGNOSIS — R0602 Shortness of breath: Secondary | ICD-10-CM | POA: Diagnosis not present

## 2017-01-23 DIAGNOSIS — F419 Anxiety disorder, unspecified: Secondary | ICD-10-CM | POA: Diagnosis not present

## 2017-01-23 DIAGNOSIS — Z79899 Other long term (current) drug therapy: Secondary | ICD-10-CM | POA: Diagnosis not present

## 2017-01-24 DIAGNOSIS — Z452 Encounter for adjustment and management of vascular access device: Secondary | ICD-10-CM | POA: Diagnosis not present

## 2017-01-25 DIAGNOSIS — S82001D Unspecified fracture of right patella, subsequent encounter for closed fracture with routine healing: Secondary | ICD-10-CM | POA: Diagnosis not present
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
## 2017-02-01 ENCOUNTER — Ambulatory Visit (HOSPITAL_COMMUNITY): Payer: Self-pay | Admitting: Psychiatry

## 2017-02-07 DIAGNOSIS — E8940 Asymptomatic postprocedural ovarian failure: Secondary | ICD-10-CM | POA: Diagnosis not present

## 2017-02-07 DIAGNOSIS — E559 Vitamin D deficiency, unspecified: Secondary | ICD-10-CM | POA: Diagnosis not present

## 2017-02-07 DIAGNOSIS — M8580 Other specified disorders of bone density and structure, unspecified site: Secondary | ICD-10-CM | POA: Diagnosis not present

## 2017-02-12 ENCOUNTER — Encounter: Payer: Self-pay | Admitting: Cardiovascular Disease

## 2017-02-12 ENCOUNTER — Ambulatory Visit (INDEPENDENT_AMBULATORY_CARE_PROVIDER_SITE_OTHER): Payer: Medicare Other | Admitting: Cardiovascular Disease

## 2017-02-12 VITALS — BP 102/60 | HR 67 | Ht 62.0 in | Wt 140.0 lb

## 2017-02-12 DIAGNOSIS — R55 Syncope and collapse: Secondary | ICD-10-CM | POA: Diagnosis not present

## 2017-02-12 DIAGNOSIS — F411 Generalized anxiety disorder: Secondary | ICD-10-CM | POA: Diagnosis not present

## 2017-02-12 DIAGNOSIS — I4891 Unspecified atrial fibrillation: Secondary | ICD-10-CM

## 2017-02-12 DIAGNOSIS — Z950 Presence of cardiac pacemaker: Secondary | ICD-10-CM

## 2017-02-12 DIAGNOSIS — I10 Essential (primary) hypertension: Secondary | ICD-10-CM | POA: Diagnosis not present

## 2017-02-12 DIAGNOSIS — W19XXXD Unspecified fall, subsequent encounter: Secondary | ICD-10-CM | POA: Diagnosis not present

## 2017-02-12 MED ORDER — AMLODIPINE BESYLATE 5 MG PO TABS: 7.5000 mg | ORAL_TABLET | Freq: Every day | ORAL | 3 refills | Status: DC

## 2017-02-12 NOTE — Addendum Note (Signed)
Addended by: Laurine Blazer on: 02/12/2017 11:38 AM   Modules accepted: Orders

## 2017-02-12 NOTE — Progress Notes (Signed)
SUBJECTIVE:  The patient presents for follow-up of atrial fibrillation, pacemaker, and syncope.  Echocardiogram in March 2018 demonstrated normal left ventricular systolic function, grade 2 diastolic dysfunction, mild mitral, mild to moderate tricuspid, mild pulmonic, and mild aortic regurgitation with mild anterior mitral leaflet prolapse.  She has problems with hypertension and I started amlodipine at her last visit. She had an episode of hypotension associated with weakness in late July and carvedilol dose was reduced to 12.5 mg bid.  She has a lot of stress at home and taking care of her husband. Her husband has anxiety and anger outbursts. She wants to continue to take care of him at home.   I personally reviewed her blood pressure log. Several systolic readings are over 150 mmHg.  She has had several falls and recently broke her right patella. Falls usually occur while she is standing still. She has Parkinson's disease. She does not follow with a neurologist. She denies palpitations.   Review of Systems: As per "subjective", otherwise negative.  Allergies  Allergen Reactions  . Codeine     REACTION: itching    Current Outpatient Prescriptions  Medication Sig Dispense Refill  . alendronate (FOSAMAX) 70 MG tablet Take 70 mg by mouth once a week. Take with a full glass of water on an empty stomach.    Marland Kitchen amLODipine (NORVASC) 5 MG tablet Take 1 tablet (5 mg total) by mouth daily. 90 tablet 0  . apixaban (ELIQUIS) 2.5 MG TABS tablet Take 1 tablet (2.5 mg total) by mouth 2 (two) times daily. 180 tablet 3  . aspirin EC 81 MG tablet Take 81 mg by mouth daily.    . carbidopa-levodopa (SINEMET IR) 25-100 MG tablet Take 1 tablet by mouth 4 (four) times daily.    . carvedilol (COREG) 25 MG tablet Take 1 tablet (25 mg total) by mouth 2 (two) times daily. (Patient taking differently: Take 12.5 mg by mouth 2 (two) times daily. ) 90 tablet 3  . cetirizine (ZYRTEC) 10 MG tablet Take 10  mg by mouth daily.    Marland Kitchen estrogens, conjugated, (PREMARIN) 0.625 MG tablet Take 0.625 mg by mouth daily. Take daily for 21 days then do not take for 7 days.    Marland Kitchen ipratropium-albuterol (DUONEB) 0.5-2.5 (3) MG/3ML SOLN Take 3 mLs by nebulization as directed.     Marland Kitchen LORazepam (ATIVAN) 0.5 MG tablet Take 0.5 mg by mouth every 8 (eight) hours.    . Melatonin 10 MG CAPS Take by mouth.    . nitroGLYCERIN (NITROSTAT) 0.4 MG SL tablet Place 0.4 mg under the tongue every 5 (five) minutes as needed for chest pain (MAX 3 TABLETS IN 15 MINS).     Marland Kitchen pantoprazole (PROTONIX) 40 MG tablet Take 40 mg by mouth daily.    Marland Kitchen PARoxetine (PAXIL) 20 MG tablet Take 20 mg by mouth daily.    . potassium chloride (K-DUR) 10 MEQ tablet Take 10 mEq by mouth daily.    . pravastatin (PRAVACHOL) 20 MG tablet Take 20 mg by mouth daily.    . propafenone (RYTHMOL) 150 MG tablet Take 150 mg by mouth every 8 (eight) hours.    . rasagiline (AZILECT) 1 MG TABS Take 1 mg by mouth daily.    . sertraline (ZOLOFT) 100 MG tablet Take 1 tablet (100 mg total) by mouth daily. 90 tablet 0  . TRIMETHOPRIM PO Take by mouth as directed.      No current facility-administered medications for this visit.  Past Medical History:  Diagnosis Date  . Parkinson's disease ([place])   . Paroxysmal atrial fibrillation (HCC)    afib  . Stroke [facility])     Past Surgical History:  Procedure Laterality Date  . CHOLECYSTECTOMY    . COLON SURGERY    . OOPHORECTOMY    . PACEMAKER IMPLANT  09/07/2010   St Jude [place] DR implanted by Dr Sharyon Cable    Social History   Social History  . Marital status: Married    Spouse name: N/A  . Number of children: N/A  . Years of education: N/A   Occupational History  . Not on file.   Social History Main Topics  . Smoking status: Never Smoker  . Smokeless tobacco: Never Used  . Alcohol use No  . Drug use: No  . Sexual activity: Not on file   Other Topics Concern  . Not on file   Social History Narrative    . No narrative on file     Vitals:   02/12/17 1114  BP: 102/60  Pulse: 67  SpO2: 97%  Weight: 140 lb (63.5 kg)  Height: 5\' 2"  (1.575 m)    Wt Readings from Last 3 Encounters:  02/12/17 140 lb (63.5 kg)  01/01/17 134 lb 12.8 oz (61.1 kg)  12/04/16 134 lb 12.8 oz (61.1 kg)     PHYSICAL EXAM General: NAD HEENT: Normal. Neck: No JVD, no thyromegaly. Lungs: Clear to auscultation bilaterally with normal respiratory effort. CV: Nondisplaced PMI.  Regular rate and rhythm, normal S1/S2, no S3/S4, no murmur. No pretibial or periankle edema.    Abdomen: Soft, nontender, no distention.  Neurologic: Alert and oriented.  Psych: Normal affect. Skin: Normal.    ECG: Most recent ECG reviewed.   Labs: No results found for: K, BUN, CREATININE, ALT, TSH, HGB   Lipids: No results found for: LDLCALC, LDLDIRECT, CHOL, TRIG, HDL     ASSESSMENT AND PLAN:  1. Syncope:Unclear etiology but may be related to Parkinson's disease. I would recommend she follow-up with a neurologist. Previously instructed not to drive.   2.Persistent atrial fibrillation:Symptomatically stable. Continue carvedilol, Rythmol, and low-dose Eliquis.  3. Hypertension: Elevated. Will increase amlodipine to 7.5 mg.  4. Pacemaker: Pacemaker interrogation demonstrated normal function. There were no findings to explain syncope.  5. Anxiety and stress with depression: She has situational anxiety and stress related to taking care of her husband at home. I previously made a behavioral therapy referral. She now sees a psychiatrist. She is currently on sertraline 100 mg daily.     Disposition: Follow up 1 year with me. Follow up with Dr. Rayann Heman as scheduled.   Kate Sable, M.D., F.A.C.C.

## 2017-02-12 NOTE — Patient Instructions (Signed)
Medication Instructions:   Increase Amlodipine to 7.5mg  daily.  Continue all other medications.    Labwork: none  Testing/Procedures: none  Follow-Up: Your physician wants you to follow up in:  1 year.  You will receive a reminder letter in the mail one-two months in advance.  If you don't receive a letter, please call our office to schedule the follow up appointment   Any Other Special Instructions Will Be Listed Below (If Applicable).  If you need a refill on your cardiac medications before your next appointment, please call your pharmacy.

## 2017-02-13 ENCOUNTER — Telehealth (HOSPITAL_COMMUNITY): Payer: Self-pay | Admitting: *Deleted

## 2017-02-13 NOTE — Telephone Encounter (Signed)
Returned phone call to patient regarding an appointment.  no answer, left voice message.

## 2017-02-14 ENCOUNTER — Telehealth (HOSPITAL_COMMUNITY): Payer: Self-pay | Admitting: *Deleted

## 2017-02-14 NOTE — Telephone Encounter (Signed)
another attempt to contact patient to schedule an appointment, no answer left voice message.
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
## 2017-02-19 ENCOUNTER — Ambulatory Visit (INDEPENDENT_AMBULATORY_CARE_PROVIDER_SITE_OTHER): Payer: Medicare Other | Admitting: Psychiatry

## 2017-02-19 ENCOUNTER — Encounter (HOSPITAL_COMMUNITY): Payer: Self-pay | Admitting: Psychiatry

## 2017-02-19 VITALS — BP 115/59 | Ht 62.0 in | Wt 136.0 lb

## 2017-02-19 DIAGNOSIS — I10 Essential (primary) hypertension: Secondary | ICD-10-CM | POA: Diagnosis not present

## 2017-02-19 DIAGNOSIS — G20 Parkinson's disease: Secondary | ICD-10-CM | POA: Diagnosis not present

## 2017-02-19 DIAGNOSIS — F331 Major depressive disorder, recurrent, moderate: Secondary | ICD-10-CM

## 2017-02-19 DIAGNOSIS — Z8673 Personal history of transient ischemic attack (TIA), and cerebral infarction without residual deficits: Secondary | ICD-10-CM | POA: Diagnosis not present

## 2017-02-19 DIAGNOSIS — G4700 Insomnia, unspecified: Secondary | ICD-10-CM | POA: Diagnosis not present

## 2017-02-19 DIAGNOSIS — I4891 Unspecified atrial fibrillation: Secondary | ICD-10-CM

## 2017-02-19 DIAGNOSIS — Z950 Presence of cardiac pacemaker: Secondary | ICD-10-CM | POA: Diagnosis not present

## 2017-02-19 DIAGNOSIS — Z79899 Other long term (current) drug therapy: Secondary | ICD-10-CM

## 2017-02-19 MED ORDER — SERTRALINE HCL 100 MG PO TABS: 150.0000 mg | ORAL_TABLET | Freq: Every day | ORAL | 0 refills | Status: DC

## 2017-02-19 NOTE — Patient Instructions (Signed)
1. Increase sertraline 150 mg daily 2. Decrease Paxil 10 mg daily for one week, then discontinue 3. Consider talking with a Education officer, museum at the facility about your husband 4. Return to clinic in one month for 30 mins 5. Obtain record from PCP

## 2017-02-21 DIAGNOSIS — Z452 Encounter for adjustment and management of vascular access device: Secondary | ICD-10-CM | POA: Diagnosis not present

## 2017-02-22 DIAGNOSIS — S82001D Unspecified fracture of right patella, subsequent encounter for closed fracture with routine healing: Secondary | ICD-10-CM | POA: Diagnosis not present
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
## 2017-03-21 ENCOUNTER — Ambulatory Visit (HOSPITAL_COMMUNITY): Payer: Self-pay | Admitting: Psychiatry

## 2017-03-25 ENCOUNTER — Ambulatory Visit (INDEPENDENT_AMBULATORY_CARE_PROVIDER_SITE_OTHER): Payer: Medicare Other | Admitting: Psychiatry

## 2017-03-25 ENCOUNTER — Encounter (HOSPITAL_COMMUNITY): Payer: Self-pay | Admitting: Psychiatry

## 2017-03-25 VITALS — BP 86/60 | HR 72 | Ht 62.0 in | Wt 136.0 lb

## 2017-03-25 DIAGNOSIS — Z634 Disappearance and death of family member: Secondary | ICD-10-CM | POA: Diagnosis not present

## 2017-03-25 DIAGNOSIS — F419 Anxiety disorder, unspecified: Secondary | ICD-10-CM | POA: Diagnosis not present

## 2017-03-25 DIAGNOSIS — Z630 Problems in relationship with spouse or partner: Secondary | ICD-10-CM | POA: Diagnosis not present

## 2017-03-25 DIAGNOSIS — G20 Parkinson's disease: Secondary | ICD-10-CM | POA: Diagnosis not present

## 2017-03-25 DIAGNOSIS — R450 Nervousness: Secondary | ICD-10-CM | POA: Diagnosis not present

## 2017-03-25 DIAGNOSIS — I4891 Unspecified atrial fibrillation: Secondary | ICD-10-CM

## 2017-03-25 DIAGNOSIS — Z8673 Personal history of transient ischemic attack (TIA), and cerebral infarction without residual deficits: Secondary | ICD-10-CM

## 2017-03-25 DIAGNOSIS — Z950 Presence of cardiac pacemaker: Secondary | ICD-10-CM | POA: Diagnosis not present

## 2017-03-25 DIAGNOSIS — I959 Hypotension, unspecified: Secondary | ICD-10-CM

## 2017-03-25 DIAGNOSIS — Z452 Encounter for adjustment and management of vascular access device: Secondary | ICD-10-CM | POA: Diagnosis not present

## 2017-03-25 DIAGNOSIS — I10 Essential (primary) hypertension: Secondary | ICD-10-CM | POA: Diagnosis not present

## 2017-03-25 DIAGNOSIS — F331 Major depressive disorder, recurrent, moderate: Secondary | ICD-10-CM

## 2017-03-25 DIAGNOSIS — R413 Other amnesia: Secondary | ICD-10-CM | POA: Diagnosis not present

## 2017-03-25 NOTE — Patient Instructions (Signed)
1. Continue sertraline 150 mg daily  2. Make sure to talk with social worker for available resources 3. Contact with your primary care doctor for hypotension 4. Contact with neurologist for parkinson disease 5. Return to clinic in one month for 30 mins 6. Consider decreasing lorazepam to 0.5 mg once a day

## 2017-04-06 DIAGNOSIS — Z23 Encounter for immunization: Secondary | ICD-10-CM | POA: Diagnosis not present

## 2017-04-12 DIAGNOSIS — I10 Essential (primary) hypertension: Secondary | ICD-10-CM | POA: Diagnosis not present

## 2017-04-12 DIAGNOSIS — M19042 Primary osteoarthritis, left hand: Secondary | ICD-10-CM | POA: Diagnosis not present

## 2017-04-12 DIAGNOSIS — S0990XA Unspecified injury of head, initial encounter: Secondary | ICD-10-CM | POA: Diagnosis not present

## 2017-04-12 DIAGNOSIS — G319 Degenerative disease of nervous system, unspecified: Secondary | ICD-10-CM | POA: Diagnosis not present

## 2017-04-12 DIAGNOSIS — Z299 Encounter for prophylactic measures, unspecified: Secondary | ICD-10-CM | POA: Diagnosis not present

## 2017-04-12 DIAGNOSIS — S0993XA Unspecified injury of face, initial encounter: Secondary | ICD-10-CM | POA: Diagnosis not present

## 2017-04-12 DIAGNOSIS — I4891 Unspecified atrial fibrillation: Secondary | ICD-10-CM | POA: Diagnosis not present

## 2017-04-12 DIAGNOSIS — R9082 White matter disease, unspecified: Secondary | ICD-10-CM | POA: Diagnosis not present

## 2017-04-12 DIAGNOSIS — M79642 Pain in left hand: Secondary | ICD-10-CM | POA: Diagnosis not present

## 2017-04-12 DIAGNOSIS — Z6821 Body mass index (BMI) 21.0-21.9, adult: Secondary | ICD-10-CM | POA: Diagnosis not present

## 2017-04-12 DIAGNOSIS — I509 Heart failure, unspecified: Secondary | ICD-10-CM | POA: Diagnosis not present

## 2017-04-12 DIAGNOSIS — G20 Parkinson's disease: Secondary | ICD-10-CM | POA: Diagnosis not present

## 2017-04-12 DIAGNOSIS — Y92009 Unspecified place in unspecified non-institutional (private) residence as the place of occurrence of the external cause: Secondary | ICD-10-CM | POA: Diagnosis not present

## 2017-04-12 DIAGNOSIS — S0093XA Contusion of unspecified part of head, initial encounter: Secondary | ICD-10-CM | POA: Diagnosis not present

## 2017-04-12 DIAGNOSIS — W19XXXA Unspecified fall, initial encounter: Secondary | ICD-10-CM | POA: Diagnosis not present

## 2017-04-12 DIAGNOSIS — S0101XA Laceration without foreign body of scalp, initial encounter: Secondary | ICD-10-CM | POA: Diagnosis not present

## 2017-04-12 DIAGNOSIS — J449 Chronic obstructive pulmonary disease, unspecified: Secondary | ICD-10-CM | POA: Diagnosis not present

## 2017-04-12 DIAGNOSIS — S0191XA Laceration without foreign body of unspecified part of head, initial encounter: Secondary | ICD-10-CM | POA: Diagnosis not present

## 2017-04-16 ENCOUNTER — Telehealth: Payer: Self-pay

## 2017-04-16 DIAGNOSIS — R55 Syncope and collapse: Secondary | ICD-10-CM

## 2017-04-16 NOTE — Telephone Encounter (Signed)
Patient states she had a bad fall on 11/22 where she busted the back of her head her face. Patient states there was a lot of bleeding but she did not seek treatment at that time. Patient stated she did go see her PCP on Friday. PCP advised patient that she needed to be admitted to the hospital to make sure nothing else was going on. Patient refused to be admitted due to not having anyone to care for her husband. Patient did agree to go to [facility] to have CT scan of head and xray of hand. Patient states while at the hospital her BP was 60/40. Patient stated her PCP wanted her to follow up with cardiology ASAP for BP to be evaluated. Patient also stated Friday evening she was standing up talking to her husband and she passed out. Patient states this happens often and she has no warning to when it is going to happen. Patient did not seek any medical help for this either. Patient states BP is all over the place. Yester BP 153/79 in the morning and 135/64 in the evening. Today BP was 162/81 in the morning and 104/42 right before she called. Patient is very concerned with her BP and the fact that she keeps passing out. First available with Dr. Bronson Ing is 1/9 and with an APP it is 12/12. Patient states she is not going to [employer]  office. Will request records from PCP and from [facility]

## 2017-04-17 NOTE — Telephone Encounter (Signed)
LMTCB

## 2017-04-17 NOTE — Telephone Encounter (Signed)
When I saw her on 02/12/17, most SBP readings were >150 mmHg. She has Parkinson's and may have an autonomic neuropathy leading to syncope. Last device interrogation did not find any arrhythmic causes. Would have her see a neurologist.

## 2017-04-17 NOTE — Telephone Encounter (Signed)
Patient notified and trying to find transportation

## 2017-04-19 DIAGNOSIS — G20 Parkinson's disease: Secondary | ICD-10-CM | POA: Diagnosis not present

## 2017-04-19 DIAGNOSIS — Z299 Encounter for prophylactic measures, unspecified: Secondary | ICD-10-CM | POA: Diagnosis not present

## 2017-04-19 DIAGNOSIS — J449 Chronic obstructive pulmonary disease, unspecified: Secondary | ICD-10-CM | POA: Diagnosis not present

## 2017-04-19 DIAGNOSIS — I509 Heart failure, unspecified: Secondary | ICD-10-CM | POA: Diagnosis not present

## 2017-04-19 DIAGNOSIS — I4891 Unspecified atrial fibrillation: Secondary | ICD-10-CM | POA: Diagnosis not present

## 2017-04-19 DIAGNOSIS — Z6821 Body mass index (BMI) 21.0-21.9, adult: Secondary | ICD-10-CM | POA: Diagnosis not present
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
## 2017-04-23 DIAGNOSIS — Z452 Encounter for adjustment and management of vascular access device: Secondary | ICD-10-CM | POA: Diagnosis not present

## 2017-04-24 ENCOUNTER — Ambulatory Visit (INDEPENDENT_AMBULATORY_CARE_PROVIDER_SITE_OTHER): Payer: Medicare Other | Admitting: Psychiatry

## 2017-04-24 VITALS — BP 130/84 | HR 94 | Ht 62.0 in | Wt 133.0 lb

## 2017-04-24 DIAGNOSIS — R450 Nervousness: Secondary | ICD-10-CM | POA: Diagnosis not present

## 2017-04-24 DIAGNOSIS — R45851 Suicidal ideations: Secondary | ICD-10-CM

## 2017-04-24 DIAGNOSIS — R413 Other amnesia: Secondary | ICD-10-CM | POA: Diagnosis not present

## 2017-04-24 DIAGNOSIS — G20 Parkinson's disease: Secondary | ICD-10-CM | POA: Diagnosis not present

## 2017-04-24 DIAGNOSIS — F419 Anxiety disorder, unspecified: Secondary | ICD-10-CM

## 2017-04-24 DIAGNOSIS — F331 Major depressive disorder, recurrent, moderate: Secondary | ICD-10-CM

## 2017-04-24 MED ORDER — SERTRALINE HCL 100 MG PO TABS: 200.0000 mg | ORAL_TABLET | Freq: Every day | ORAL | 0 refills | Status: DC

## 2017-04-24 NOTE — Patient Instructions (Signed)
1. Icrease sertraline 200 mg daily  2. Make sure to talk with social worker for available resources 3. Return to clinic in one month for 30 mins

## 2017-04-25 ENCOUNTER — Other Ambulatory Visit: Payer: Self-pay | Admitting: *Deleted

## 2017-04-25 MED ORDER — CARVEDILOL 25 MG PO TABS: 12.5000 mg | ORAL_TABLET | Freq: Two times a day (BID) | ORAL | 3 refills | Status: DC

## 2017-05-19 DIAGNOSIS — J449 Chronic obstructive pulmonary disease, unspecified: Secondary | ICD-10-CM | POA: Diagnosis not present

## 2017-05-19 DIAGNOSIS — R55 Syncope and collapse: Secondary | ICD-10-CM | POA: Diagnosis not present

## 2017-05-19 DIAGNOSIS — R42 Dizziness and giddiness: Secondary | ICD-10-CM | POA: Diagnosis not present

## 2017-05-19 DIAGNOSIS — Z823 Family history of stroke: Secondary | ICD-10-CM | POA: Diagnosis not present

## 2017-05-19 DIAGNOSIS — Z79899 Other long term (current) drug therapy: Secondary | ICD-10-CM | POA: Diagnosis not present

## 2017-05-19 DIAGNOSIS — I4510 Unspecified right bundle-branch block: Secondary | ICD-10-CM | POA: Diagnosis not present

## 2017-05-19 DIAGNOSIS — I10 Essential (primary) hypertension: Secondary | ICD-10-CM | POA: Diagnosis not present

## 2017-05-19 DIAGNOSIS — I951 Orthostatic hypotension: Secondary | ICD-10-CM | POA: Diagnosis not present

## 2017-05-19 DIAGNOSIS — Z7901 Long term (current) use of anticoagulants: Secondary | ICD-10-CM | POA: Diagnosis not present

## 2017-05-19 DIAGNOSIS — K449 Diaphragmatic hernia without obstruction or gangrene: Secondary | ICD-10-CM | POA: Diagnosis not present

## 2017-05-19 DIAGNOSIS — I4891 Unspecified atrial fibrillation: Secondary | ICD-10-CM | POA: Diagnosis not present

## 2017-05-19 DIAGNOSIS — R531 Weakness: Secondary | ICD-10-CM | POA: Diagnosis not present

## 2017-05-19 DIAGNOSIS — N949 Unspecified condition associated with female genital organs and menstrual cycle: Secondary | ICD-10-CM | POA: Diagnosis not present

## 2017-05-19 DIAGNOSIS — I509 Heart failure, unspecified: Secondary | ICD-10-CM | POA: Diagnosis not present

## 2017-05-19 DIAGNOSIS — R5381 Other malaise: Secondary | ICD-10-CM | POA: Diagnosis not present

## 2017-05-19 DIAGNOSIS — Z87440 Personal history of urinary (tract) infections: Secondary | ICD-10-CM | POA: Diagnosis not present

## 2017-05-19 DIAGNOSIS — I110 Hypertensive heart disease with heart failure: Secondary | ICD-10-CM | POA: Diagnosis not present

## 2017-05-20 DIAGNOSIS — I10 Essential (primary) hypertension: Secondary | ICD-10-CM | POA: Diagnosis not present

## 2017-05-20 DIAGNOSIS — I509 Heart failure, unspecified: Secondary | ICD-10-CM | POA: Diagnosis not present

## 2017-05-20 DIAGNOSIS — I110 Hypertensive heart disease with heart failure: Secondary | ICD-10-CM | POA: Diagnosis not present

## 2017-05-20 DIAGNOSIS — F419 Anxiety disorder, unspecified: Secondary | ICD-10-CM | POA: Diagnosis not present

## 2017-05-23 ENCOUNTER — Emergency Department (HOSPITAL_COMMUNITY): Payer: Medicare Other

## 2017-05-23 ENCOUNTER — Encounter (HOSPITAL_COMMUNITY): Payer: Self-pay | Admitting: Emergency Medicine

## 2017-05-23 ENCOUNTER — Other Ambulatory Visit: Payer: Self-pay

## 2017-05-23 ENCOUNTER — Inpatient Hospital Stay (HOSPITAL_COMMUNITY)
Admission: EM | Admit: 2017-05-23 | Discharge: 2017-05-28 | DRG: 057 | Disposition: A | Discharge status: Skilled Nursing Facility | Payer: Medicare Other | Attending: Internal Medicine | Admitting: Internal Medicine

## 2017-05-23 DIAGNOSIS — J449 Chronic obstructive pulmonary disease, unspecified: Secondary | ICD-10-CM | POA: Diagnosis present

## 2017-05-23 DIAGNOSIS — F411 Generalized anxiety disorder: Secondary | ICD-10-CM | POA: Diagnosis present

## 2017-05-23 DIAGNOSIS — I480 Paroxysmal atrial fibrillation: Secondary | ICD-10-CM | POA: Diagnosis not present

## 2017-05-23 DIAGNOSIS — R112 Nausea with vomiting, unspecified: Secondary | ICD-10-CM | POA: Diagnosis not present

## 2017-05-23 DIAGNOSIS — I371 Nonrheumatic pulmonary valve insufficiency: Secondary | ICD-10-CM | POA: Diagnosis not present

## 2017-05-23 DIAGNOSIS — I5032 Chronic diastolic (congestive) heart failure: Secondary | ICD-10-CM | POA: Diagnosis present

## 2017-05-23 DIAGNOSIS — Z8673 Personal history of transient ischemic attack (TIA), and cerebral infarction without residual deficits: Secondary | ICD-10-CM

## 2017-05-23 DIAGNOSIS — I110 Hypertensive heart disease with heart failure: Secondary | ICD-10-CM | POA: Diagnosis not present

## 2017-05-23 DIAGNOSIS — R262 Difficulty in walking, not elsewhere classified: Secondary | ICD-10-CM | POA: Diagnosis not present

## 2017-05-23 DIAGNOSIS — I351 Nonrheumatic aortic (valve) insufficiency: Secondary | ICD-10-CM | POA: Diagnosis not present

## 2017-05-23 DIAGNOSIS — L821 Other seborrheic keratosis: Secondary | ICD-10-CM | POA: Diagnosis not present

## 2017-05-23 DIAGNOSIS — M810 Age-related osteoporosis without current pathological fracture: Secondary | ICD-10-CM | POA: Diagnosis not present

## 2017-05-23 DIAGNOSIS — Z950 Presence of cardiac pacemaker: Secondary | ICD-10-CM

## 2017-05-23 DIAGNOSIS — R296 Repeated falls: Secondary | ICD-10-CM | POA: Diagnosis not present

## 2017-05-23 DIAGNOSIS — G4700 Insomnia, unspecified: Secondary | ICD-10-CM | POA: Diagnosis present

## 2017-05-23 DIAGNOSIS — I071 Rheumatic tricuspid insufficiency: Secondary | ICD-10-CM

## 2017-05-23 DIAGNOSIS — I4892 Unspecified atrial flutter: Secondary | ICD-10-CM | POA: Diagnosis not present

## 2017-05-23 DIAGNOSIS — R159 Full incontinence of feces: Secondary | ICD-10-CM

## 2017-05-23 DIAGNOSIS — M6281 Muscle weakness (generalized): Secondary | ICD-10-CM | POA: Diagnosis not present

## 2017-05-23 DIAGNOSIS — Z79899 Other long term (current) drug therapy: Secondary | ICD-10-CM | POA: Diagnosis not present

## 2017-05-23 DIAGNOSIS — I083 Combined rheumatic disorders of mitral, aortic and tricuspid valves: Secondary | ICD-10-CM | POA: Diagnosis present

## 2017-05-23 DIAGNOSIS — R32 Unspecified urinary incontinence: Secondary | ICD-10-CM

## 2017-05-23 DIAGNOSIS — Z885 Allergy status to narcotic agent status: Secondary | ICD-10-CM | POA: Diagnosis not present

## 2017-05-23 DIAGNOSIS — I160 Hypertensive urgency: Secondary | ICD-10-CM | POA: Diagnosis present

## 2017-05-23 DIAGNOSIS — R55 Syncope and collapse: Secondary | ICD-10-CM | POA: Diagnosis present

## 2017-05-23 DIAGNOSIS — I481 Persistent atrial fibrillation: Secondary | ICD-10-CM | POA: Diagnosis not present

## 2017-05-23 DIAGNOSIS — R4020 Unspecified coma: Secondary | ICD-10-CM | POA: Diagnosis not present

## 2017-05-23 DIAGNOSIS — I340 Nonrheumatic mitral (valve) insufficiency: Secondary | ICD-10-CM | POA: Diagnosis not present

## 2017-05-23 DIAGNOSIS — F419 Anxiety disorder, unspecified: Secondary | ICD-10-CM | POA: Diagnosis not present

## 2017-05-23 DIAGNOSIS — R404 Transient alteration of awareness: Secondary | ICD-10-CM | POA: Diagnosis not present

## 2017-05-23 DIAGNOSIS — I639 Cerebral infarction, unspecified: Secondary | ICD-10-CM | POA: Diagnosis not present

## 2017-05-23 DIAGNOSIS — R2681 Unsteadiness on feet: Secondary | ICD-10-CM | POA: Diagnosis not present

## 2017-05-23 DIAGNOSIS — F331 Major depressive disorder, recurrent, moderate: Secondary | ICD-10-CM | POA: Diagnosis not present

## 2017-05-23 DIAGNOSIS — Z91040 Latex allergy status: Secondary | ICD-10-CM | POA: Diagnosis not present

## 2017-05-23 DIAGNOSIS — R079 Chest pain, unspecified: Secondary | ICD-10-CM | POA: Diagnosis not present

## 2017-05-23 DIAGNOSIS — Z7982 Long term (current) use of aspirin: Secondary | ICD-10-CM

## 2017-05-23 DIAGNOSIS — R109 Unspecified abdominal pain: Secondary | ICD-10-CM | POA: Diagnosis not present

## 2017-05-23 DIAGNOSIS — G20A1 Parkinson's disease without dyskinesia, without mention of fluctuations: Secondary | ICD-10-CM

## 2017-05-23 DIAGNOSIS — K219 Gastro-esophageal reflux disease without esophagitis: Secondary | ICD-10-CM | POA: Diagnosis not present

## 2017-05-23 DIAGNOSIS — G20 Parkinson's disease: Principal | ICD-10-CM | POA: Diagnosis present

## 2017-05-23 DIAGNOSIS — I959 Hypotension, unspecified: Secondary | ICD-10-CM | POA: Diagnosis not present

## 2017-05-23 DIAGNOSIS — Z7901 Long term (current) use of anticoagulants: Secondary | ICD-10-CM | POA: Diagnosis not present

## 2017-05-23 DIAGNOSIS — Z87440 Personal history of urinary (tract) infections: Secondary | ICD-10-CM | POA: Diagnosis not present

## 2017-05-23 DIAGNOSIS — R011 Cardiac murmur, unspecified: Secondary | ICD-10-CM | POA: Diagnosis not present

## 2017-05-23 DIAGNOSIS — F329 Major depressive disorder, single episode, unspecified: Secondary | ICD-10-CM | POA: Diagnosis not present

## 2017-05-23 DIAGNOSIS — I10 Essential (primary) hypertension: Secondary | ICD-10-CM | POA: Diagnosis not present

## 2017-05-23 DIAGNOSIS — E785 Hyperlipidemia, unspecified: Secondary | ICD-10-CM | POA: Diagnosis not present

## 2017-05-23 DIAGNOSIS — Z8781 Personal history of (healed) traumatic fracture: Secondary | ICD-10-CM

## 2017-05-23 DIAGNOSIS — I5030 Unspecified diastolic (congestive) heart failure: Secondary | ICD-10-CM | POA: Diagnosis not present

## 2017-05-23 HISTORY — DX: Chronic obstructive pulmonary disease, unspecified: J44.9

## 2017-05-23 HISTORY — DX: Heart failure, unspecified: I50.9

## 2017-05-23 LAB — URINALYSIS, ROUTINE W REFLEX MICROSCOPIC
Bilirubin Urine: NEGATIVE
Glucose, UA: NEGATIVE mg/dL
Hgb urine dipstick: NEGATIVE
Ketones, ur: NEGATIVE mg/dL
Leukocytes, UA: NEGATIVE
Nitrite: NEGATIVE
Protein, ur: NEGATIVE mg/dL
Specific Gravity, Urine: 1.006 (ref 1.005–1.030)
pH: 7 (ref 5.0–8.0)

## 2017-05-23 LAB — BASIC METABOLIC PANEL
Anion gap: 7 (ref 5–15)
BUN: 14 mg/dL (ref 6–20)
CO2: 27 mmol/L (ref 22–32)
Calcium: 8.2 mg/dL — ABNORMAL LOW (ref 8.9–10.3)
Chloride: 105 mmol/L (ref 101–111)
Creatinine, Ser: 1.21 mg/dL — ABNORMAL HIGH (ref 0.44–1.00)
GFR calc Af Amer: 47 mL/min — ABNORMAL LOW (ref >60)
GFR calc non Af Amer: 40 mL/min — ABNORMAL LOW (ref >60)
GLUCOSE: 123 mg/dL — AB (ref 65–99)
POTASSIUM: 4 mmol/L (ref 3.5–5.1)
Sodium: 139 mmol/L (ref 135–145)

## 2017-05-23 LAB — CBC
HEMATOCRIT: 32.2 % — AB (ref 36.0–46.0)
HEMOGLOBIN: 10.1 g/dL — AB (ref 12.0–15.0)
MCH: 30.4 pg (ref 26.0–34.0)
MCHC: 31.4 g/dL (ref 30.0–36.0)
MCV: 97 fL (ref 78.0–100.0)
Platelets: 244 10*3/uL (ref 150–400)
RBC: 3.32 MIL/uL — ABNORMAL LOW (ref 3.87–5.11)
RDW: 13.1 % (ref 11.5–15.5)
WBC: 3.8 10*3/uL — ABNORMAL LOW (ref 4.0–10.5)

## 2017-05-23 LAB — I-STAT TROPONIN, ED: Troponin i, poc: 0 ng/mL (ref 0.00–0.08)

## 2017-05-23 MED ORDER — ACETAMINOPHEN 325 MG PO TABS
650.0000 mg | ORAL_TABLET | Freq: Four times a day (QID) | ORAL | Status: DC | PRN
  Administered 2017-05-24 – 2017-05-28 (×7): 650 mg via ORAL
  Filled 2017-05-23 (×7): qty 2

## 2017-05-23 MED ORDER — APIXABAN 2.5 MG PO TABS
2.5000 mg | ORAL_TABLET | Freq: Two times a day (BID) | ORAL | Status: DC
  Administered 2017-05-24 – 2017-05-28 (×10): 2.5 mg via ORAL
  Filled 2017-05-23 (×10): qty 1

## 2017-05-23 MED ORDER — CARVEDILOL 6.25 MG PO TABS
6.2500 mg | ORAL_TABLET | Freq: Two times a day (BID) | ORAL | Status: DC
  Administered 2017-05-24 (×2): 6.25 mg via ORAL
  Filled 2017-05-23 (×2): qty 1

## 2017-05-23 MED ORDER — PROPAFENONE HCL 150 MG PO TABS
150.0000 mg | ORAL_TABLET | Freq: Two times a day (BID) | ORAL | Status: DC
  Administered 2017-05-24 – 2017-05-28 (×10): 150 mg via ORAL
  Filled 2017-05-23 (×12): qty 1

## 2017-05-23 MED ORDER — CARBIDOPA-LEVODOPA 25-100 MG PO TABS
2.0000 | ORAL_TABLET | ORAL | Status: DC
  Administered 2017-05-24 – 2017-05-28 (×9): 2 via ORAL
  Filled 2017-05-23 (×10): qty 2

## 2017-05-23 MED ORDER — SERTRALINE HCL 100 MG PO TABS
100.0000 mg | ORAL_TABLET | Freq: Two times a day (BID) | ORAL | Status: DC
  Administered 2017-05-24 – 2017-05-28 (×10): 100 mg via ORAL
  Filled 2017-05-23 (×10): qty 1

## 2017-05-23 MED ORDER — ONDANSETRON HCL 4 MG/2ML IJ SOLN: 4.0000 mg | Freq: Four times a day (QID) | INTRAMUSCULAR | Status: DC | PRN

## 2017-05-23 MED ORDER — PANTOPRAZOLE SODIUM 40 MG PO TBEC
40.0000 mg | DELAYED_RELEASE_TABLET | Freq: Every day | ORAL | Status: DC
  Administered 2017-05-24 – 2017-05-28 (×5): 40 mg via ORAL
  Filled 2017-05-23 (×5): qty 1

## 2017-05-23 MED ORDER — TRIMETHOPRIM 100 MG PO TABS
100.0000 mg | ORAL_TABLET | Freq: Every day | ORAL | Status: DC
  Administered 2017-05-24 – 2017-05-28 (×5): 100 mg via ORAL
  Filled 2017-05-23 (×5): qty 1

## 2017-05-23 MED ORDER — IPRATROPIUM-ALBUTEROL 0.5-2.5 (3) MG/3ML IN SOLN: 3.0000 mL | Freq: Four times a day (QID) | RESPIRATORY_TRACT | Status: DC | PRN

## 2017-05-23 MED ORDER — SODIUM CHLORIDE 0.9 % IV BOLUS (SEPSIS)
500.0000 mL | Freq: Once | INTRAVENOUS | Status: AC
  Administered 2017-05-23: 500 mL via INTRAVENOUS

## 2017-05-23 MED ORDER — PRAVASTATIN SODIUM 20 MG PO TABS
20.0000 mg | ORAL_TABLET | Freq: Every day | ORAL | Status: DC
  Administered 2017-05-24 – 2017-05-28 (×5): 20 mg via ORAL
  Filled 2017-05-23 (×5): qty 1

## 2017-05-23 MED ORDER — MELATONIN 3 MG PO TABS
6.0000 mg | ORAL_TABLET | Freq: Every evening | ORAL | Status: DC | PRN
  Filled 2017-05-23 (×2): qty 2

## 2017-05-23 MED ORDER — ACETAMINOPHEN 650 MG RE SUPP: 650.0000 mg | Freq: Four times a day (QID) | RECTAL | Status: DC | PRN

## 2017-05-23 MED ORDER — RASAGILINE MESYLATE 1 MG PO TABS
1.0000 mg | ORAL_TABLET | Freq: Every day | ORAL | Status: DC
  Administered 2017-05-24 – 2017-05-28 (×5): 1 mg via ORAL
  Filled 2017-05-23 (×5): qty 1

## 2017-05-23 MED ORDER — SODIUM CHLORIDE 0.9 % IV SOLN: INTRAVENOUS | Status: AC

## 2017-05-23 MED ORDER — SENNOSIDES-DOCUSATE SODIUM 8.6-50 MG PO TABS
1.0000 | ORAL_TABLET | Freq: Every evening | ORAL | Status: DC | PRN
  Administered 2017-05-24: 1 via ORAL
  Filled 2017-05-23: qty 1

## 2017-05-23 MED ORDER — ESTROGENS CONJUGATED 0.625 MG PO TABS
0.6250 mg | ORAL_TABLET | Freq: Every day | ORAL | Status: DC
  Administered 2017-05-24 – 2017-05-27 (×5): 0.625 mg via ORAL
  Filled 2017-05-23 (×7): qty 1

## 2017-05-23 MED ORDER — ONDANSETRON HCL 4 MG PO TABS: 4.0000 mg | ORAL_TABLET | Freq: Four times a day (QID) | ORAL | Status: DC | PRN

## 2017-05-23 NOTE — ED Notes (Signed)
Regular diet tray ordered

## 2017-05-23 NOTE — H&P (Signed)
Date: 05/23/2017               Patient Name:  Kaylee Drake MRN: [id number]  DOB: [birth year] Age / Sex: 84 y.o., female   PCP: Glenda Chroman, MD         Medical Service: Internal Medicine Teaching Service         Attending Physician: Dr. Oda Kilts, MD    First Contact: Dr. Tarri Abernethy Pager: [phone]  Second Contact: Dr. Danford Bad Pager: [phone]       After Hours (After 5p/  First Contact Pager: [phone]  weekends / holidays): Second Contact Pager: [phone]   Chief Complaint: Syncope  History of Present Illness: Kaylee Drake is a 84 year old female atrial fibrillation s/p pacemaker placement, HFpEF, Parkinson's disease, and recurrent syncope who presented to the ED after syncopal episode. She states that for the past 5 years she has been dealing with recurrent syncope and multiple injuries due to the syncopal episodes. Most recent injury was a fractured patella. Over the last year her syncopal episodes have increased in frequency. These episodes primarily occur with position change and are always preceded with a feeling of weakness and shakiness. On Monday she stood up from the dining room table felt weak and shaky and subsequently syncopized. She quickly regained consciousness. She was taken by EMS to [facility] for further evaluation. She was found to be orthostatic and subsequently told not to take her blood pressure medicines. She was to follow-up with her PCP as an outpatient. That following evening she checked her blood pressure and it was found to be over 462 systolic. She called EMS who took her to the hospital. She was admitted for hypertensive urgency management and subsequently discharged the following morning. Yesterday morning she was in her normal state of health when she elected to go visit [facility]. She spent the night, this morning when she went to get up she felt weak and shaky and subsequently syncopized. EMS was called and she was brought to [facility]  for further evaluation.   She has been evaluated multiple times for these recurrent syncopal episodes both as an inpatient and an outpatient. She has had her pacemaker interrogated multiple times with most recent interrogation in November that was unremarkable. The ED is currently working to interrogate her pacemaker. Per her cardiologist's note he recommended that she follow up with outpatient neurology as the primary concern is that she is having autonomic dysfunction secondary to her advanced Parkinson's disease. She is scheduled to follow-up with outpatient neurology on January 25.  She otherwise feels well and denies any other prodromal symptoms. She denies tonic-clonic movements, tongue biting, postictal state. She does have bladder and bowel incontinence at baseline and is unable to say whether she has incontinence during these episodes. She endorses some chest tightness with most recent syncopal episode but otherwise denies any other symptoms. Currently lives at a retirement facility. She used to live at an assisted living facility however wanted more independence. She now feels that she needs more assistance in her ADLs.  Meds:  Current Meds  Medication Sig  . alendronate (FOSAMAX) 70 MG tablet Take 70 mg by mouth every Wednesday. Take with a full glass of water on an empty stomach.   Marland Kitchen amLODipine (NORVASC) 5 MG tablet Take 1.5 tablets (7.5 mg total) by mouth daily.  Marland Kitchen apixaban (ELIQUIS) 2.5 MG TABS tablet Take 1 tablet (2.5 mg total) by mouth 2 (two) times daily.  Marland Kitchen  aspirin EC 81 MG tablet Take 81 mg by mouth daily.  . carbidopa-levodopa (SINEMET IR) 25-100 MG tablet Take 2 tablets by mouth See admin instructions. 2 tablets two times a day/9 AM and 9 PM  . omeprazole (PRILOSEC) 20 MG capsule Take 20 mg by mouth daily before breakfast.  . rasagiline (AZILECT) 1 MG TABS Take 1 mg by mouth daily.  . sertraline (ZOLOFT) 100 MG tablet Take 2 tablets (200 mg total) by mouth daily. (Patient taking  differently: Take 100 mg by mouth 2 (two) times daily. )  . trimethoprim (TRIMPEX) 100 MG tablet Take 100 mg by mouth daily.   Allergies: Allergies as of 05/23/2017 - Review Complete 05/23/2017  Allergen Reaction Noted  . Codeine Itching   . Adhesive [tape] Rash and Other (See Comments) 05/23/2017  . Latex Rash 05/23/2017   Past Medical History:  Diagnosis Date  . CHF (congestive heart failure) ([place])   . COPD (chronic obstructive pulmonary disease) ([place])   . Parkinson's disease ([place])   . Paroxysmal atrial fibrillation (HCC)    afib  . Stroke [facility])    Family History:  Mother and Father deceased  Daughter alive and healthy   Social History:  Lives in a retirement facility.  Denies the use of EtOH, Tobacco, or illicit drug use.   Review of Systems: A complete ROS was negative except as per HPI.   Physical Exam: Blood pressure (!) 102/53, pulse 63, temperature (!) 97.4 F (36.3 C), temperature source Oral, resp. rate 17, height 5\' 2"  (1.575 m), weight 130 lb (59 kg), SpO2 95 %.  Gen.: Well-nourished elderly female in no acute distress HEENT: Normocephalic, atraumatic, moist mucous membranes CV: Regular rate and rhythm, systolic murmur noted, no rub Pulm: Good air movement, no crackles, no wheezing Abdomen: Active bowel sounds, soft, nondistended, no tenderness palpation Extremities: No lower extremity edema, pulses palpable in all extremities Skin: Diffuse seborrheic keratosis, warm and dry Neuro: Alert and oriented 3, no focal neuro abnormalities  EKG: personally reviewed: my interpretation is sinus rhythm with PR prolongation. Normal axis. Possible RBBB.    CT Head and Cervical Spine  1. Atrophy with chronic small vessel ischemic disease. No acute intracranial abnormality. 2. No acute cervical spine fracture. Degenerative disc disease C4-5 and C5-6 with degenerative uncinate spurring C4 through C7. 3. Chronic mild T2 compression with minimal retropulsion as  before.  Assessment & Plan by Problem: Principal Problem:   Syncope and collapse Active Problems:   Parkinson's disease ([place])   Pacemaker, artificial  Syncope. Kaylee Drake is a 84 year old female atrial fibrillation s/p pacemaker placement, HFpEF, Parkinson's disease, and recurrent syncope who presented to the ED after syncopal episode. At this point the most likely etiology of her recurrent syncope is autonomic dysfunction secondary to her Parkinson's disease. We will check orthostatic vital signs. Unfortunately the treatment options for this are very limited and include both pharmacologic (droxidopa, fludrocortisone, pyridostigmine, midodrine) and nonpharmacologic options (increasing salt intake, stockings, abdominal binders). She has an outpatient neurologist follow-up on January 25. I do not see the need for an inpatient consult at this time. EKG and initial troponin are reassuring that her syncope is not due to cardiac etiology; however, we are currently working on interrogating her pacemaker and she will be admitted to telemetry. Her labs are otherwise unremarkable. - Obtain orthostatic vital signs  - Judicious fluids in the setting of the patient's heart failure  - Admit to tele for 24 hours  - Hold BP medications and only  treat standing BP - PT/OT consult  - Social work consult   Parkinson's disease. - Does not follow up with outpatient neurology; however, has appointment to establish care with neurology on January 25. - Is currently on carbidopa-levodopa and rasagiline. Continue current therapy while in hospital.  Paroxysmal atrial fibrillation. - Currently in sinus rhythm - Currently on Eliquis for anticoagulation, carvedilol for rate control, propafenone for rhythm control. - Awaiting pacemaker interrogation  HFpEF. - Recent echocardiogram in March 2008 demonstrating normal EF with grade 2 diastolic dysfunction, mild mitral renal, mild to moderate tricuspid, mild pulmonic, and  mild aortic regurgitation - Continue carvedilol. Hold amlodipine.  Diet: Nothing by mouth until bedside swallow eval is completely VTE ppx: Eliquis  Code Status: Full Code  Dispo: Admit patient to Inpatient with expected length of stay greater than 2 midnights.  Signed: Ina Homes, MD 05/23/2017, 5:18 PM  My Pager: [phone]

## 2017-05-23 NOTE — ED Provider Notes (Signed)
[facility] EMERGENCY DEPARTMENT Provider Note   CSN: [id number] Arrival date & time: 05/23/17  1322     History   Chief Complaint Chief Complaint  Patient presents with  . Loss of Consciousness    HPI Kaylee Drake is a 84 y.o. female.  Kaylee Drake is a 84 y.o. Female presents to the emergency department after a syncopal episode today.  Patient is visiting family at [facility] when staff found her unresponsive on the ground.  Patient describes a syncopal episode today where everything went black and she fell to the ground.  Patient does report that before she passed out today she had chest pain all the way across her chest.  She denies current chest pain.  She is not sure if she had chest pain yesterday before the syncopal event.  This happened several times in the past several days.  She was seen yesterday for the [facility].  She reports she was told to hold all of her blood pressure medications, which she did today.  She does have a history of CHF and has a English as a second language teacher.  Initially she had no complaints, and now she reports developing a generalized headache.  No other complaints.  No recent illness.  She reports has been eating and drinking normally.  She is on Eliquis for paroxysmal A. fib.  She denies fevers, coughing, shortness of breath, current chest pain, abdominal pain, nausea, vomiting, diarrhea, hematemesis, hematochezia, urinary symptoms, leg pain, leg swelling or rashes.   The history is provided by the patient and medical records. No language interpreter was used.  Loss of Consciousness   Associated symptoms include chest pain. Pertinent negatives include abdominal pain, back pain, congestion, fever, headaches, nausea, palpitations and vomiting.    Past Medical History:  Diagnosis Date  . CHF (congestive heart failure) ([place])   . COPD (chronic obstructive pulmonary disease) ([place])   . Parkinson's disease ([place])    . Paroxysmal atrial fibrillation (HCC)    afib  . Stroke [facility])     Patient Active Problem List   Diagnosis Date Noted  . MDD (major depressive disorder), recurrent episode, moderate ([place]) 12/04/2016  . ATRIAL FIBRILLATION 10/07/2009  . ATRIAL FLUTTER 10/07/2009  . CVA 10/07/2009    Past Surgical History:  Procedure Laterality Date  . CHOLECYSTECTOMY    . COLON SURGERY    . OOPHORECTOMY    . PACEMAKER IMPLANT  09/07/2010   [employer] Uncertain DR implanted by Dr Sharyon Cable    OB History    No data available       Home Medications    Prior to Admission medications   Medication Sig Start Date End Date Taking? Authorizing Provider  alendronate (FOSAMAX) 70 MG tablet Take 70 mg by mouth every Wednesday. Take with a full glass of water on an empty stomach.    Yes [provider]  amLODipine (NORVASC) 5 MG tablet Take 1.5 tablets (7.5 mg total) by mouth daily. 02/12/17 05/23/17 Yes Herminio Commons, MD  apixaban (ELIQUIS) 2.5 MG TABS tablet Take 1 tablet (2.5 mg total) by mouth 2 (two) times daily. 10/16/16  Yes Herminio Commons, MD  aspirin EC 81 MG tablet Take 81 mg by mouth daily.   Yes [provider]  carbidopa-levodopa (SINEMET IR) 25-100 MG tablet Take 2 tablets by mouth See admin instructions. 2 tablets two times a day/9 AM and 9 PM   Yes [provider]  omeprazole (PRILOSEC) 20 MG capsule Take 20 mg by mouth daily before breakfast.   Yes [provider]  rasagiline (AZILECT) 1 MG TABS Take 1 mg by mouth daily.   Yes [provider]  sertraline (ZOLOFT) 100 MG tablet Take 2 tablets (200 mg total) by mouth daily. Patient taking differently: Take 100 mg by mouth 2 (two) times daily.  04/24/17  Yes Hisada, Elie Goody, MD  trimethoprim (TRIMPEX) 100 MG tablet Take 100 mg by mouth daily.   Yes [provider]  carvedilol (COREG) 25 MG tablet Take 0.5 tablets (12.5 mg total) by mouth 2 (two) times daily. 04/25/17   Herminio Commons, MD  cetirizine (ZYRTEC) 10 MG tablet Take 10 mg by mouth daily.    [provider]  estrogens, conjugated, (PREMARIN) 0.625 MG tablet Take 0.625 mg by mouth daily. Take daily for 21 days then do not take for 7 days.    [provider]  ipratropium-albuterol (DUONEB) 0.5-2.5 (3) MG/3ML SOLN Take 3 mLs by nebulization as directed.     [provider]  Melatonin 10 MG CAPS Take by mouth.    [provider]  nitroGLYCERIN (NITROSTAT) 0.4 MG SL tablet Place 0.4 mg under the tongue every 5 (five) minutes as needed for chest pain (MAX 3 TABLETS IN 15 MINS).     [provider]  pantoprazole (PROTONIX) 40 MG tablet Take 40 mg by mouth daily.    [provider]  potassium chloride (K-DUR) 10 MEQ tablet Take 10 mEq by mouth daily.    [provider]  pravastatin (PRAVACHOL) 20 MG tablet Take 20 mg by mouth daily.    [provider]  propafenone (RYTHMOL) 150 MG tablet Take 150 mg by mouth every 8 (eight) hours.    [provider]  Vitamin D, Ergocalciferol, (DRISDOL) 50000 units CAPS capsule Take 50,000 Units by mouth every 7 (seven) days. 04/28/17   [provider]    Family History History reviewed. No pertinent family history.  Social History Social History   Tobacco Use  . Smoking status: Never Smoker  . Smokeless tobacco: Never Used  Substance Use Topics  . Alcohol use: No  . Drug use: No     Allergies   Codeine; Adhesive [tape]; and Latex   Review of Systems Review of Systems  Constitutional: Positive for fatigue. Negative for chills and fever.  HENT: Negative for congestion and sore throat.   Eyes: Negative for visual disturbance.  Respiratory: Negative for cough, shortness of breath and wheezing.   Cardiovascular: Positive for chest pain and syncope. Negative for palpitations and leg swelling.  Gastrointestinal: Negative for abdominal pain, diarrhea, nausea and vomiting.  Genitourinary:  Negative for dysuria.  Musculoskeletal: Negative for back pain and neck pain.  Skin: Negative for rash.  Neurological: Positive for syncope. Negative for headaches.     Physical Exam Updated Vital Signs BP (!) 102/53   Pulse 63   Temp (!) 97.4 F (36.3 C) (Oral)   Resp 17   Ht 5\' 2"  (1.575 m)   Wt 59 kg (130 lb)   SpO2 95%   BMI 23.78 kg/m   Physical Exam  Constitutional: She is oriented to person, place, and time. She appears well-developed and well-nourished. No distress.  Nontoxic-appearing.  HENT:  Head: Normocephalic and atraumatic.  Right Ear: External ear normal.  Left Ear: External ear normal.  Mouth/Throat: Oropharynx is clear and moist.  Eyes: Conjunctivae and EOM are normal. Pupils are equal, round,  and reactive to light. Right eye exhibits no discharge. Left eye exhibits no discharge.  Neck: Neck supple. No JVD present. No tracheal deviation present.  Cardiovascular: Normal rate, regular rhythm, normal heart sounds and intact distal pulses. Exam reveals no gallop and no friction rub.  No murmur heard. Bilateral radial, posterior tibialis and dorsalis pedis pulses are intact.    Pulmonary/Chest: Effort normal and breath sounds normal. No respiratory distress. She has no wheezes. She has no rales.  Lungs are clear to ascultation bilaterally. Symmetric chest expansion bilaterally. No increased work of breathing. No rales or rhonchi.    Abdominal: Soft. There is no tenderness. There is no guarding.  Musculoskeletal: She exhibits no edema or tenderness.  No LE edema or TTP. Patient is spontaneously moving all extremities in a coordinated fashion exhibiting good strength.   Lymphadenopathy:    She has no cervical adenopathy.  Neurological: She is alert and oriented to person, place, and time. No cranial nerve deficit or sensory deficit. She exhibits normal muscle tone. Coordination normal.  Patient is alert and oriented 3. Speech is clear and coherent. Cranial nerves  are intact. Sensation and strength is intact to bilateral upper and lower extremities.   Skin: Skin is warm and dry. Capillary refill takes less than 2 seconds. No rash noted. She is not diaphoretic. No erythema. No pallor.  Psychiatric: She has a normal mood and affect. Her behavior is normal.  Nursing note and vitals reviewed.    ED Treatments / Results  Labs (all labs ordered are listed, but only abnormal results are displayed) Labs Reviewed  BASIC METABOLIC PANEL - Abnormal; Notable for the following components:      Result Value   Glucose, Bld 123 (*)    Creatinine, Ser 1.21 (*)    Calcium 8.2 (*)    GFR calc non Af Amer 40 (*)    GFR calc Af Amer 47 (*)    All other components within normal limits  CBC - Abnormal; Notable for the following components:   WBC 3.8 (*)    RBC 3.32 (*)    Hemoglobin 10.1 (*)    HCT 32.2 (*)    All other components within normal limits  URINALYSIS, ROUTINE W REFLEX MICROSCOPIC  I-STAT TROPONIN, ED    EKG  EKG Interpretation  Date/Time:  Thursday May 23 2017 13:25:04 EST Ventricular Rate:  68 PR Interval:    QRS Duration: 81 QT Interval:  469 QTC Calculation: 499 R Axis:   -166 Text Interpretation:  Sinus rhythm Prolonged PR interval Low voltage, precordial leads Right ventricular hypertrophy ST depr, consider ischemia, anterolateral lds Borderline ST elevation Borderline prolonged QT interval AGREE. no STEMI Confirmed by Charlesetta Shanks [phone]) on 05/23/2017 3:55:35 PM       Radiology Dg Chest 2 View  Result Date: 05/23/2017 CLINICAL DATA:  84 y/o  F; syncope and lower back pain. EXAM: CHEST  2 VIEW COMPARISON:  07/23/2016 chest radiograph FINDINGS: Right catheter tip projects over upper SVC and is stable. Two lead pacemaker. Stable mild cardiomegaly. Aortic atherosclerosis with calcification. Moderate to large hiatal hernia is stable. Chronic fracture deformity of right humerus diaphysis. Moderate multilevel degenerative changes of  the spine. No focal consolidation, effusion, or pneumothorax. IMPRESSION: No acute pulmonary process identified. Cardiomegaly. Moderate to large hiatal hernia. Electronically Signed   By: Kristine Garbe M.D.   On: 05/23/2017 15:03   Ct Head Wo Contrast  Result Date: 05/23/2017 CLINICAL DATA:  Patient found unresponsive.  Recurrent syncope.  EXAM: CT HEAD WITHOUT CONTRAST CT CERVICAL SPINE WITHOUT CONTRAST TECHNIQUE: Multidetector CT imaging of the head and cervical spine was performed following the standard protocol without intravenous contrast. Multiplanar CT image reconstructions of the cervical spine were also generated. COMPARISON:  07/23/2016 FINDINGS: CT HEAD FINDINGS Brain: Stable superficial and central atrophy. Chronic stable mild-to-moderate small vessel ischemic disease of periventricular white matter. No acute intracranial hemorrhage, mass, midline shift nor extra-axial fluid collections. No large vascular territory infarct. Vascular: No hyperdense vessel or unexpected calcification. Skull: No acute skull fracture. Sinuses/Orbits: No acute finding.  Bilateral lens replacement. Other: None CT CERVICAL SPINE FINDINGS Alignment: Normal. Skull base and vertebrae: No evidence of acute fracture of the cervical spine. Stable mild anterior wedge deformity of the T2 vertebral body with minimal retropulsion. No suspicious osseous lesions Soft tissues and spinal canal: No prevertebral fluid or swelling. No visible canal hematoma. Disc levels: Moderate to marked disc space narrowing C4-5 and C5-6 with slight grade retrolisthesis of C4 on C5. Mild facet arthropathy. Uncinate spurring bilaterally at C4-5, C5-6 and C6-7. Upper chest: No acute abnormality. Right jugular Port-A-Cath is partially imaged. Other: None IMPRESSION: 1. Atrophy with chronic small vessel ischemic disease. No acute intracranial abnormality. 2. No acute cervical spine fracture. Degenerative disc disease C4-5 and C5-6 with degenerative  uncinate spurring C4 through C7. 3. Chronic mild T2 compression with minimal retropulsion as before. Electronically Signed   By: Ashley Royalty M.D.   On: 05/23/2017 15:52   Ct Cervical Spine Wo Contrast  Result Date: 05/23/2017 CLINICAL DATA:  Patient found unresponsive.  Recurrent syncope. EXAM: CT HEAD WITHOUT CONTRAST CT CERVICAL SPINE WITHOUT CONTRAST TECHNIQUE: Multidetector CT imaging of the head and cervical spine was performed following the standard protocol without intravenous contrast. Multiplanar CT image reconstructions of the cervical spine were also generated. COMPARISON:  07/23/2016 FINDINGS: CT HEAD FINDINGS Brain: Stable superficial and central atrophy. Chronic stable mild-to-moderate small vessel ischemic disease of periventricular white matter. No acute intracranial hemorrhage, mass, midline shift nor extra-axial fluid collections. No large vascular territory infarct. Vascular: No hyperdense vessel or unexpected calcification. Skull: No acute skull fracture. Sinuses/Orbits: No acute finding.  Bilateral lens replacement. Other: None CT CERVICAL SPINE FINDINGS Alignment: Normal. Skull base and vertebrae: No evidence of acute fracture of the cervical spine. Stable mild anterior wedge deformity of the T2 vertebral body with minimal retropulsion. No suspicious osseous lesions Soft tissues and spinal canal: No prevertebral fluid or swelling. No visible canal hematoma. Disc levels: Moderate to marked disc space narrowing C4-5 and C5-6 with slight grade retrolisthesis of C4 on C5. Mild facet arthropathy. Uncinate spurring bilaterally at C4-5, C5-6 and C6-7. Upper chest: No acute abnormality. Right jugular Port-A-Cath is partially imaged. Other: None IMPRESSION: 1. Atrophy with chronic small vessel ischemic disease. No acute intracranial abnormality. 2. No acute cervical spine fracture. Degenerative disc disease C4-5 and C5-6 with degenerative uncinate spurring C4 through C7. 3. Chronic mild T2  compression with minimal retropulsion as before. Electronically Signed   By: Ashley Royalty M.D.   On: 05/23/2017 15:52    Procedures Procedures (including critical care time)  CRITICAL CARE Performed by: Hanley Hays   Total critical care time: 35  minutes  Critical care time was exclusive of separately billable procedures and treating other patients.  Critical care was necessary to treat or prevent imminent or life-threatening deterioration.  Critical care was time spent personally by me on the following activities: development of treatment plan with patient and/or surrogate as well as  nursing, discussions with consultants, evaluation of patient's response to treatment, examination of patient, obtaining history from patient or surrogate, ordering and performing treatments and interventions, ordering and review of laboratory studies, ordering and review of radiographic studies, pulse oximetry and re-evaluation of patient's condition.   Medications Ordered in ED Medications  sodium chloride 0.9 % bolus 500 mL (500 mLs Intravenous New Bag/Given 05/23/17 1417)     Initial Impression / Assessment and Plan / ED Course  I have reviewed the triage vital signs and the nursing notes.  Pertinent labs & imaging results that were available during my care of the patient were reviewed by me and considered in my medical decision making (see chart for details).    This is a 84 y.o. Female presents to the emergency department after a syncopal episode today.  Patient is visiting family at [facility] when staff found her unresponsive on the ground.  Patient describes a syncopal episode today where everything went black and she fell to the ground.  Patient does report that before she passed out today she had chest pain all the way across her chest.  She denies current chest pain.  She is not sure if she had chest pain yesterday before the syncopal event.  This happened several times in the past  several days.  She was seen yesterday for the same event at [facility].  She reports she was told to hold all of her blood pressure medications, which she did today.  She does have a history of CHF and has a English as a second language teacher.  Initially she had no complaints, and now she reports developing a generalized headache.  No other complaints.  No recent illness.  She reports has been eating and drinking normally.  She is on Eliquis for paroxysmal A. fib.  On my arrival to examine the patient her blood pressure was noted to be 77 systolic.  She awakens to verbal stimuli and is alert and oriented.  Fluid bolus started and blood pressure rechecked and this is improved.  She complains of a generalized headache.  She has no focal neurological deficits.  Lungs are clear to auscultation bilaterally.  No lower extremity edema or tenderness.  I cannot see pacer spikes on her EKG.  No evidence of STEMI on her EKG. No visible signs or palpated signs of trauma.  Troponin is not elevated.  BMP shows a creatinine 1.21.  No baseline to compare.  CBC shows a white count of 3800.  Hemoglobin is 10.1.  No baseline to compare.  CT head shows no acute intracranial abnormality.  No acute cervical spine fracture. Chest x-ray shows no acute pulmonary process identified.  Cardiomegaly present. At reevaluation patient's blood pressure is 161 systolic after half liter fluid bolus.  Gentle hydration due to patient's CHF history.  Question if patient's pacemaker is not pacing.  [place] pacemaker has been interrogated, but no report has been provided yet.  We will plan for admission to the patient's recent multiple syncopal episodes.  Patient agrees with plan for admission.  I consulted with internal medicine teaching service Dr. Romelle Starcher Molt who accepted the patient for admission.   This patient was discussed with and evaluated by Dr. Colvin Caroli who agrees with assessment and plan.   Final Clinical Impressions(s) / ED  Diagnoses   Final diagnoses:  Syncope and collapse  Chest pain, rule out acute myocardial infarction  Hypotension, unspecified hypotension type  Pacemaker    ED Discharge Orders  None       Waynetta Pean, PA-C 05/23/17 1623    Charlesetta Shanks, MD 05/24/17 [phone]

## 2017-05-23 NOTE — Clinical Social Work Note (Signed)
Clinical Social Work Assessment  Patient Details  Name: DANIAL SISLEY MRN: [id number] Date of Birth: 09-11-1931  Date of referral:  05/23/17               Reason for consult:  Facility Placement                Permission sought to share information with:  Facility Sport and exercise psychologist, Family Supports Permission granted to share information::  Yes, Verbal Permission Granted  Name::        Agency::     Relationship::     Contact Information:     Housing/Transportation Living arrangements for the past 2 months:  Independent Living Facility([place], [place]) Source of Information:  Patient, Adult Children Patient Interpreter Needed:  None Criminal Activity/Legal Involvement Pertinent to Current Situation/Hospitalization:    Significant Relationships:  Adult Children, Spouse Lives with:  Self Do you feel safe going back to the place where you live?  Yes Need for family participation in patient care:  Yes (Comment)  Care giving concerns:  Care giving concerns is pt requiring more intensive care.   Social Worker assessment / plan:  CSW consulted for SNF placement. CSW met with pt, pt's daughter, and grandson at pt's bedside. CSW informed pt that she was consulted for potential SNF placement. Pt is agreeable to SNF placement if that is determined to be needed at discharge. Pt's husband is at [facility]. Pt's daughter informed CSW that pt's husband cannot leave the hospital to go to a SNF. Pt requested that pt and pt's husband go to the same place. Both CSW and pt's daughter reminded pt that that could not happen. CSW reminded pt and pt's family that as her discharge gets closer, there will be more concrete discussions about where she will be discharged to. Pt and pt's husband are living at [facility] in [place], [place].   Employment status:  Retired Forensic scientist:  Medicare PT Recommendations:  Not assessed at this time Information / Referral to community resources:      Patient/Family's Response to care:  Pt and pt's family agreeable to plan of care.   Patient/Family's Understanding of and Emotional Response to Diagnosis, Current Treatment, and Prognosis:  Pt and pt's family expressed understanding current plan of care and did not have any questions or concerns for CSW at this time.   Emotional Assessment Appearance:  Appears stated age Attitude/Demeanor/Rapport:    Affect (typically observed):  Accepting, Appropriate, Pleasant Orientation:  Oriented to Self, Oriented to Situation, Oriented to Place, Oriented to  Time Alcohol / Substance use:    Psych involvement (Current and /or in the community):  No (Comment)  Discharge Needs  Concerns to be addressed:  No discharge needs identified Readmission within the last 30 days:  No Current discharge risk:  None Barriers to Discharge:  No Barriers Identified   Wendelyn Breslow, LCSW 05/23/2017, 6:08 PM

## 2017-05-23 NOTE — ED Notes (Signed)
Patient left room for X-ray

## 2017-05-23 NOTE — ED Notes (Signed)
Pt questioning when cardiology will be here to see her. I spoke with admitting who states cards will not be seeing her. Explained this to pt.

## 2017-05-23 NOTE — ED Notes (Signed)
Patient transported to CT 

## 2017-05-23 NOTE — ED Provider Notes (Signed)
Medical screening examination/treatment/procedure(s) were conducted as a shared visit with non-physician practitioner(s) and myself.  I personally evaluated the patient during the encounter.   EKG Interpretation  Date/Time:  Thursday May 23 2017 13:25:04 EST Ventricular Rate:  68 PR Interval:    QRS Duration: 81 QT Interval:  469 QTC Calculation: 499 R Axis:   -166 Text Interpretation:  Sinus rhythm Prolonged PR interval Low voltage, precordial leads Right ventricular hypertrophy ST depr, consider ischemia, anterolateral lds Borderline ST elevation Borderline prolonged QT interval AGREE. no STEMI Confirmed by Charlesetta Shanks [phone]) on 05/23/2017 3:55:35 PM     Patient has had multiple episodes of syncope over the past couple of days.  Today she lost consciousness while at [place] visiting her husband.  She was found unresponsive.  She reports she also experienced a pain in her chest.  Is alert and appropriate.  No active respiratory distress.  At this time, heart is regular.  Lungs grossly clear.  No increased work of breathing.  Neurologically patient is coordinated and alert.  Diagnostic studies reviewed.  I agree with plan of management.   Charlesetta Shanks, MD 05/24/17 [phone]

## 2017-05-23 NOTE — ED Triage Notes (Signed)
Patient arrived via [facility]. Per EMS patient was visiting her husband at [place], staff found her unresponsive. Per EMS, patient was recently seen for syncope at [facility] on 12/31(med records in PT's possession), also seen 1/2 for syncope. PT arrived with 4lit [place]- 97%, A&O X 4, 55mls of Normal Saline given to patient in route by EMS, History of CHF, PT follows command, DENIES PAIN AT THIS TIME.

## 2017-05-24 ENCOUNTER — Other Ambulatory Visit: Payer: Self-pay

## 2017-05-24 ENCOUNTER — Inpatient Hospital Stay (HOSPITAL_COMMUNITY): Payer: Medicare Other

## 2017-05-24 DIAGNOSIS — I5030 Unspecified diastolic (congestive) heart failure: Secondary | ICD-10-CM

## 2017-05-24 DIAGNOSIS — F419 Anxiety disorder, unspecified: Secondary | ICD-10-CM

## 2017-05-24 DIAGNOSIS — R296 Repeated falls: Secondary | ICD-10-CM

## 2017-05-24 DIAGNOSIS — Z79899 Other long term (current) drug therapy: Secondary | ICD-10-CM

## 2017-05-24 DIAGNOSIS — R55 Syncope and collapse: Secondary | ICD-10-CM

## 2017-05-24 DIAGNOSIS — Z7901 Long term (current) use of anticoagulants: Secondary | ICD-10-CM

## 2017-05-24 DIAGNOSIS — I480 Paroxysmal atrial fibrillation: Secondary | ICD-10-CM

## 2017-05-24 DIAGNOSIS — Z950 Presence of cardiac pacemaker: Secondary | ICD-10-CM

## 2017-05-24 DIAGNOSIS — G20 Parkinson's disease: Principal | ICD-10-CM

## 2017-05-24 DIAGNOSIS — R011 Cardiac murmur, unspecified: Secondary | ICD-10-CM

## 2017-05-24 DIAGNOSIS — F329 Major depressive disorder, single episode, unspecified: Secondary | ICD-10-CM

## 2017-05-24 LAB — BASIC METABOLIC PANEL
Anion gap: 6 (ref 5–15)
BUN: 14 mg/dL (ref 6–20)
CO2: 27 mmol/L (ref 22–32)
Calcium: 8.7 mg/dL — ABNORMAL LOW (ref 8.9–10.3)
Chloride: 107 mmol/L (ref 101–111)
Creatinine, Ser: 0.95 mg/dL (ref 0.44–1.00)
GFR calc Af Amer: >60 mL/min (ref >60)
GFR calc non Af Amer: 54 mL/min — ABNORMAL LOW (ref >60)
Glucose, Bld: 98 mg/dL (ref 65–99)
Potassium: 3.5 mmol/L (ref 3.5–5.1)
Sodium: 140 mmol/L (ref 135–145)

## 2017-05-24 LAB — CBC
HCT: 34.1 % — ABNORMAL LOW (ref 36.0–46.0)
Hemoglobin: 10.6 g/dL — ABNORMAL LOW (ref 12.0–15.0)
MCH: 30.1 pg (ref 26.0–34.0)
MCHC: 31.1 g/dL (ref 30.0–36.0)
MCV: 96.9 fL (ref 78.0–100.0)
Platelets: 253 10*3/uL (ref 150–400)
RBC: 3.52 MIL/uL — ABNORMAL LOW (ref 3.87–5.11)
RDW: 13.1 % (ref 11.5–15.5)
WBC: 4.8 10*3/uL (ref 4.0–10.5)

## 2017-05-24 MED ORDER — CARVEDILOL 3.125 MG PO TABS
3.1250 mg | ORAL_TABLET | Freq: Two times a day (BID) | ORAL | Status: DC
  Administered 2017-05-24 – 2017-05-28 (×9): 3.125 mg via ORAL
  Filled 2017-05-24 (×9): qty 1

## 2017-05-24 MED ORDER — HYDROXYZINE HCL 25 MG PO TABS
25.0000 mg | ORAL_TABLET | Freq: Four times a day (QID) | ORAL | Status: DC
Start: 2017-05-24 — End: 2017-05-25
  Administered 2017-05-24 – 2017-05-25 (×4): 25 mg via ORAL
  Filled 2017-05-24 (×4): qty 1

## 2017-05-24 NOTE — Evaluation (Signed)
Physical Therapy Evaluation Patient Details Name: Kaylee Drake MRN: [id number] DOB: [birth year] Today's Date: 05/24/2017   History of Present Illness  Pt is an 84 y/o female admitted following syncopal event. CT of the head and spine negative for acute abnormality, however, did note chronic T2 compression fx. PMH inlcudes parkinsons disease, a fib, CVA, CHF, COPD, and s/p pacemaker.   Clinical Impression  Pt is admitted secondary to problem above with deficits below. PTA, pt was living in [place] and was supposed to be using cane per daughter, however, was not using. Upon eval, pt very unsteady and presenting with weakness. Required min A for steadying throughout gait without AD and was reaching for rails in the hallway. Recommending SNF at d/c given pt current limitations and pt lives alone; husband is currently in hospital. Pt currently a very high fall risk. Will continue to follow acutely to maximize functional mobility independence and safety.     Follow Up Recommendations SNF;Supervision/Assistance - 24 hour    Equipment Recommendations  Rolling walker with 5" wheels    Recommendations for Other Services       Precautions / Restrictions Precautions Precautions: Fall Precaution Comments: Daughter reports two falls in the past week.  Restrictions Weight Bearing Restrictions: No      Mobility  Bed Mobility Overal bed mobility: Needs Assistance Bed Mobility: Supine to Sit;Sit to Supine     Supine to sit: Supervision Sit to supine: Supervision   General bed mobility comments: Supervision for safety. Use of bed rails and elevated HOB.   Transfers Overall transfer level: Needs assistance Equipment used: None Transfers: Sit to/from Stand Sit to Stand: Min assist         General transfer comment: Min A for lift assist and steadying upon standing.   Ambulation/Gait Ambulation/Gait assistance: Min assist Ambulation Distance (Feet): 40 Feet Assistive device:  None Gait Pattern/deviations: Step-through pattern;Decreased stride length;Drifts right/left Gait velocity: Decreased  Gait velocity interpretation: Below normal speed for age/gender General Gait Details: Pt very unsteady during gait without AD. Reaching for handrails in hallway secondary to unsteadiness. Required min A for steadying throughout.   Stairs            Wheelchair Mobility    Modified Rankin (Stroke Patients Only)       Balance Overall balance assessment: Needs assistance Sitting-balance support: No upper extremity supported;Feet supported Sitting balance-Leahy Scale: Good     Standing balance support: No upper extremity supported;During functional activity Standing balance-Leahy Scale: Poor Standing balance comment: Reliant on external assist                              Pertinent Vitals/Pain Pain Assessment: 0-10 Pain Score: 6  Pain Location: headache  Pain Descriptors / Indicators: Headache Pain Intervention(s): Limited activity within patient's tolerance;Monitored during session;Repositioned    Home Living Family/patient expects to be discharged to:: Skilled nursing facility Living Arrangements: Alone Available Help at Discharge: Family;Available PRN/intermittently Type of Home: Independent living facility Home Access: Level entry     Home Layout: One level Home Equipment: Cane - single point      Prior Function Level of Independence: Needs assistance   Gait / Transfers Assistance Needed: Per daughter was supposed to be using cane, however, did not.   ADL's / Homemaking Assistance Needed: Daughter reports someone came in to clean and cook.         Hand Dominance  Extremity/Trunk Assessment   Upper Extremity Assessment Upper Extremity Assessment: Defer to OT evaluation    Lower Extremity Assessment Lower Extremity Assessment: Generalized weakness    Cervical / Trunk Assessment Cervical / Trunk Assessment:  Kyphotic  Communication   Communication: HOH  Cognition Arousal/Alertness: Awake/alert Behavior During Therapy: Anxious Overall Cognitive Status: Within Functional Limits for tasks assessed                                 General Comments: Slightly anxious after hearing recommendations for SNF. Pt tearful and concerned about her husband.       General Comments General comments (skin integrity, edema, etc.): Pt's daughter present during session. Educated about need for SNF and pt initially very tearful and asking if her husband could go to rehab with her. Husband is currently at [facility] and daughter helped to explain that pt's husband was not medically appropriate to go to SNF.     Exercises     Assessment/Plan    PT Assessment Patient needs continued PT services  PT Problem List Decreased strength;Decreased mobility;Decreased balance;Decreased knowledge of use of DME;Decreased safety awareness;Decreased knowledge of precautions;Pain       PT Treatment Interventions DME instruction;Gait training;Functional mobility training;Therapeutic activities;Therapeutic exercise;Balance training;Neuromuscular re-education;Patient/family education    PT Goals (Current goals can be found in the Care Plan section)  Acute Rehab PT Goals Patient Stated Goal: to be with her husband  PT Goal Formulation: With patient Time For Goal Achievement: 06/07/17 Potential to Achieve Goals: Good    Frequency Min 2X/week   Barriers to discharge Decreased caregiver support lives alone; husband is currently in the hospital.     Co-evaluation               AM-PAC PT "6 Clicks" Daily Activity  Outcome Measure Difficulty turning over in bed (including adjusting bedclothes, sheets and blankets)?: A Little Difficulty moving from lying on back to sitting on the side of the bed? : A Little Difficulty sitting down on and standing up from a chair with arms (e.g., wheelchair, bedside commode,  etc,.)?: Unable Help needed moving to and from a bed to chair (including a wheelchair)?: A Little Help needed walking in hospital room?: A Little Help needed climbing 3-5 steps with a railing? : A Lot 6 Click Score: 15    End of Session Equipment Utilized During Treatment: Gait belt Activity Tolerance: Patient tolerated treatment well Patient left: in bed;with call bell/phone within reach;with family/visitor present Nurse Communication: Mobility status PT Visit Diagnosis: Unsteadiness on feet (R26.81);Other abnormalities of gait and mobility (R26.89);Other symptoms and signs involving the nervous system (R29.898);History of falling (Z91.81);Repeated falls (R29.6);Muscle weakness (generalized) (M62.81)    Time: 7902-4097 PT Time Calculation (min) (ACUTE ONLY): 23 min   Charges:   PT Evaluation $PT Eval Low Complexity: 1 Low PT Treatments $Gait Training: 8-22 mins   PT G Codes:        Leighton Ruff, PT, DPT  Acute Rehabilitation Services  Pager: [phone]   Rudean Hitt 05/24/2017, 4:49 PM

## 2017-05-24 NOTE — Progress Notes (Signed)
OT Cancellation Note  Patient Details Name: Kaylee Drake MRN: [id number] DOB: 10-06-1931   Cancelled Treatment:    Reason Eval/Treat Not Completed: Fatigue/lethargy limiting ability to participate  Britt Bottom 05/24/2017, 2:25 PM

## 2017-05-24 NOTE — Care Management Note (Addendum)
Case Management Note  Patient Details  Name: Kaylee Drake MRN: [id number] Date of Birth: 10-05-1931  Subjective/Objective:    Syncope, PD, paroxysmal atrial fibrillation, HF                Action/Plan: Discharge Planning: NCM spoke to pt's Kinnie Feil # [phone]. States pt and her husband live at [facility]. Husband is currently at [facility]. Waiting PT/OT recommendations. Dtr is interested in [place] rehab. CSW referral for SNF.   PCP Jerene Bears B MD  Expected Discharge Date:                 Expected Discharge Plan:  [place]  In-House Referral:  Clinical Social Work  Discharge planning Services  CM Consult  Post Acute Care Choice:  NA Choice offered to:  NA  DME Arranged:  N/A DME Agency:  NA  HH Arranged:  NA HH Agency:  NA  Status of Service:  In process, will continue to follow  If discussed at Long Length of Stay Meetings, dates discussed:    Additional Comments:  Erenest Rasher, RN 05/24/2017, 3:30 PM

## 2017-05-24 NOTE — Progress Notes (Signed)
  Date: 05/24/2017  Patient name: Kaylee Drake  Medical record number: [id number]  Date of birth: 1932/05/07   I have seen and evaluated this patient and I have discussed the plan of care with the house staff. Please see their note for complete details. I concur with their findings with the following additions/corrections:   [name] is an 84 year old woman with a history of Parkinson's, anxiety, depression, HFpEF, A. fib status post pacemaker who presented to the ED after a syncopal episode. She has had difficulty with recurrent episodes of syncope, and this is her second one this week. The episodes primarily occur from standing and without warning. She sustained multiple injuries as a result of these falls, including a head laceration and limb injuries. Extensive inpatient and outpatient workup of these recurrent episodes has not revealed an etiology, and interrogation of her pacemaker has not shown any arrhythmias leading to these episodes. 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  Management of her syncopal episodes has proven very challenging. She is currently on amlodipine and carvedilol for blood pressure management and rate control. When she stopped these medications after the previous syncopal episode, her blood pressure spiked to 200/100. She is terrified of having a stroke as many of her family members have had debilitating strokes, so she is afraid to let her blood pressure run that high.  She has an upcoming appointment with neurology for initial evaluation and discussion of these symptoms. While she is here, we will consult them to see if they suggest any medication initiation for her to try to help with this. In the meantime we will try decreasing her carvedilol slightly to 3.125.  In addition, she reports significant long-standing anxiety, for which she takes sertraline and Xanax. She asked about receiving Xanax when she is in the  hospital, and we discussed that this is a problematic medication for her as it may make her more likely to fall. She said that her PCP and had a similar discussion with her, but she is unaware of alternatives. We discussed trialing hydroxyzine as well as continuing the SSRI.  She'll need an evaluation by PT and OT before discharge to ensure we can figure out a safe discharge plan for her.  Oda Kilts, MD 05/24/2017, 4:06 PM

## 2017-05-24 NOTE — Progress Notes (Signed)
*  PRELIMINARY RESULTS* Vascular Ultrasound Carotid Duplex (Doppler) has been completed.  Preliminary findings: Bilateral 1-39% ICA stenosis, antegrade vertebral flow.  Sluggish flow noted in dilated area of left IVJ.   Everrett Coombe 05/24/2017, 4:08 PM

## 2017-05-24 NOTE — Progress Notes (Addendum)
   Subjective: Patient is very emotional this AM. Concerned about multiple issues but primarily about placement and being near her husband. She is concerned these syncopal episodes may be seizures. We explained that drop seizures are highly unlikely given her age. She is requesting her Xanax, we discussed that this is not a good idea given her frequent falls and will try hydroxyzine instead. We will discuss the case with neurology today. She agrees. All questions and concerns addressed.   Objective: Vital signs in last 24 hours: Vitals:   05/23/17 2215 05/23/17 2300 05/24/17 0015 05/24/17 0521  BP: 138/72 (!) 141/70 138/60 (!) 128/54  Pulse: 79 79 75 75  Resp:   17 19  Temp:   98.4 F (36.9 C) 98.3 F (36.8 C)  TempSrc:   Oral Oral  SpO2: 97% 95% 95% 97%  Weight:   133 lb 12.8 oz (60.7 kg)   Height:   5\' 2"  (1.575 m)    General: Elderly female in no acute distress Pulm: Good air movement with no wheezing or crackles CV: RRR, systolic murmur noted Abdomen: Active bowel sounds, soft, non-distended Extremities: No LE edema Neuro: Alert and oriented   Assessment/Plan:  Syncope.  - Most likely etiology is autonomic dysfunction secondary to her Parkinson's disease given that her syncopal episodes primarily occur with position change and her BP is labile  - Orthostatics negative but after she received some IVF -  Treatment options for autonomic dysfuctnion include both pharmacologic (droxidopa, fludrocortisone, pyridostigmine, midodrine) and nonpharmacologic options (increasing salt intake, stockings, abdominal binders). - PT/OT consulted  - Social work consulted  - Continue to hold BP medication and only treat standing BP - Spoke with neurology who recommended repeating orthostatics and obtaining carotid dopplers (to assess for bilateral carotid artery stenosis). They recommended against starting medications at this point and primarily focusing on lifestyle modification   Parkinson's  disease. - Continue carbidopa-levodopa and rasagiline.   Paroxysmal atrial fibrillation. - Currently in sinus rhythm - Currently on Eliquis for anticoagulation, carvedilol for rate control, propafenone for rhythm control. - Decreased Carvedilol to 3.125 mg BID - Awaiting pacemaker interrogation  HFpEF. - Recent echocardiogram in March 2008 demonstrating normal EF with grade 2 diastolic dysfunction, mild mitral renal, mild to moderate tricuspid, mild pulmonic, and mild aortic regurgitation - Decreased Carvedilol to 3.125 mg BID. Hold amlodipine.  Anxiety - Try Hydroxyzine 25 mg QID  Dispo: Anticipated discharge in approximately 1-2 day(s).   Ina Homes, MD 05/24/2017, 6:06 AM My Pager: [phone]

## 2017-05-25 DIAGNOSIS — R109 Unspecified abdominal pain: Secondary | ICD-10-CM

## 2017-05-25 MED ORDER — BUSPIRONE HCL 5 MG PO TABS
5.0000 mg | ORAL_TABLET | Freq: Three times a day (TID) | ORAL | Status: DC
  Administered 2017-05-25 – 2017-05-28 (×9): 5 mg via ORAL
  Filled 2017-05-25 (×12): qty 1

## 2017-05-25 MED ORDER — RAMELTEON 8 MG PO TABS
8.0000 mg | ORAL_TABLET | Freq: Every day | ORAL | Status: DC
  Administered 2017-05-25 – 2017-05-27 (×3): 8 mg via ORAL
  Filled 2017-05-25 (×4): qty 1

## 2017-05-25 NOTE — Clinical Social Work Note (Addendum)
CSW spoke with pt at bedside. Pt states her husband is terminal at [facility]. Pt wishes she could spend time with her husband during his last bit of time. Pt was very emotional. Pt states she has to be with him. Pt is not going to be agreeable to anything other than being with her husband. [facility] does have skilled beds. CSW will reach out to [facility] to determine the likelihood of pt getting a skilled bed.  PASRR pending. Pt cannot go to a SNF without PASRR.  11:41 [facility] will look at pt's referral.  Loletha Grayer, [place]

## 2017-05-25 NOTE — Progress Notes (Signed)
Internal Medicine Attending:   I saw and examined the patient. I reviewed the resident's note and I agree with the resident's findings and plan as documented in the resident's note. No further syncopal episodes.  C/O some abdominal cramping, she is concerned this could be due to recent addition of hydrozixine. This is possible. Would suggest trail of D/C of hydroxizine and start buspar.  Also c/o insomnia since xanax was discontinued. Melatonin helps some.  Would try ramelteon, if not helping could try mirtazapine.  Otherwise she is stable for discharge to a SNF bed when one available.  My other thoughts are: if syncopal episodes return would consider thigh high pressure stockings, could even consider an abdominal binder to help with autonomic dysregulation.

## 2017-05-25 NOTE — Progress Notes (Signed)
   Subjective: Patient was seen talking on phone with daughter this morning. She feels well and has no complaints. She is agreeable to SNF placement although is concerned about not being placed with her husband.  Objective: Vital signs in last 24 hours: Vitals:   05/24/17 1724 05/24/17 2104 05/25/17 0530 05/25/17 1000  BP: (!) 142/66 119/61 (!) 152/65 (!) 171/67  Pulse: 68 71 70 63  Resp: 20 19 18 18   Temp: 97.8 F (36.6 C) 98.1 F (36.7 C) 98.1 F (36.7 C) 98 F (36.7 C)  TempSrc: Oral Oral Oral Oral  SpO2: 97% 94% 95% 96%  Weight:  133 lb 13 oz (60.7 kg)    Height:       General: Elderly female in no acute distress, looks comfortable. Talking on phone. Pulm: Good air movement with no wheezing or crackles CV: RRR, systolic murmur noted RUSB. Abdomen: Active bowel sounds, soft, non-distended Extremities: No LE edema Neuro: Alert and oriented, pleasant.   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 Neurology was consulted for medication assistance although they recommend against this and focus on lifestyle modification instead. PT/OT recommend SNF placement and CSW has been consulted to assist with placement. Her husband is placed in [facility] and she is quite concerned over the possibility of not being placed with him.  -Continue to hold most BP medications -Difficult situation, work on lifestyle modifications. TED hose placed.  -Appreciate CSW   HTN Blood pressures creeping up. She was restarted on reduced dose of her home Coreg 3.125mg  BID last night however hypertensive this morning to 170/67. We are in a tight spot as she has had several injuries due to her repeated syncopal episodes. When her home coreg and amlodipine were held, her blood pressures were 200's/100's and patient is very concerned about possibility of stroke. I'm hesitant to restart Amlodipine as it does increase risk of orthostatic hypotension.  -Continue  Coreg 3.125mg  BID -Could consider resuming reduced dose of Amlodipine  Parkinson's disease. - Continue carbidopa-levodopa and rasagiline.   Paroxysmal atrial fibrillation. -She remains in sinus rhythm. Currently on Eliquis for anticoagulation, carvedilol for rate control, propafenone for rhythm control.  HFpEF. - Recent echocardiogram in March 2018 demonstrating normal EF with grade 2 diastolic dysfunction. Mild regurg of several valves.  - Decreased Carvedilol to 3.125 mg BID. Hold amlodipine.  Anxiety - Try Hydroxyzine 25 mg QID  Dispo: Anticipated discharge in approximately 1-2 day(s).   Kaylee Buchan, DO 05/25/2017, 11:46 AM My Pager: [phone]

## 2017-05-25 NOTE — Clinical Social Work Note (Signed)
[facility] will take pt on Monday. CSW will check on PASRR first thing Monday morning. MD notified.   [place], [place]

## 2017-05-25 NOTE — NC FL2 (Signed)
[place] LEVEL OF CARE SCREENING TOOL     IDENTIFICATION  Patient Name: Kaylee Drake Birthdate: 04/27/1932 Sex: female Admission Date (Current Location): 05/23/2017  [facility] and [place] Number:  Herbalist and Address:  [facility], Kaylee Drake [address] [place], [place] [address]      Provider Number: [id number]  Attending Physician Name and Address:  Oda Kilts, MD  Relative Name and Phone Number:       Current Level of Care: Hospital Recommended Level of Care: [place] Prior Approval Number:    Date Approved/Denied:   PASRR Number:    Discharge Plan: SNF    Current Diagnoses: Patient Active Problem List   Diagnosis Date Noted  . Syncope and collapse 05/23/2017  . Parkinson's disease ([place]) 05/23/2017  . Pacemaker, artificial 05/23/2017  . MDD (major depressive disorder), recurrent episode, moderate ([place]) 12/04/2016  . ATRIAL FIBRILLATION 10/07/2009  . ATRIAL FLUTTER 10/07/2009  . CVA 10/07/2009    Orientation RESPIRATION BLADDER Height & Weight     Self, Situation, Time, Place  Normal Continent Weight: 133 lb 13 oz (60.7 kg) Height:  5\' 2"  (157.5 cm)  BEHAVIORAL SYMPTOMS/MOOD NEUROLOGICAL BOWEL NUTRITION STATUS      Continent (Please see d/c summary)  AMBULATORY STATUS COMMUNICATION OF NEEDS Skin   Limited Assist Verbally Normal                       Personal Care Assistance Level of Assistance  Feeding, Dressing, Bathing Bathing Assistance: Limited assistance Feeding assistance: Independent Dressing Assistance: Limited assistance     Functional Limitations Info  Sight, Hearing, Speech Sight Info: Adequate Hearing Info: Adequate Speech Info: Adequate    SPECIAL CARE FACTORS FREQUENCY  PT (By licensed PT), OT (By licensed OT)     PT Frequency: 3x OT Frequency: 3x            Contractures Contractures Info: Not present    Additional Factors Info  Code Status,  Allergies Code Status Info: Full Code Allergies Info: Codeine, Adhesive Tape, Latex           Current Medications (05/25/2017):  This is the current hospital active medication list Current Facility-Administered Medications  Medication Dose Route Frequency Provider Last Rate Last Dose  . acetaminophen (TYLENOL) tablet 650 mg  650 mg Oral Q6H PRN Molt, Bethany, DO   650 mg at 05/24/17 1659   Or  . acetaminophen (TYLENOL) suppository 650 mg  650 mg Rectal Q6H PRN Molt, Bethany, DO      . apixaban (ELIQUIS) tablet 2.5 mg  2.5 mg Oral BID Molt, Bethany, DO   2.5 mg at 05/25/17 0851  . carbidopa-levodopa (SINEMET IR) 25-100 MG per tablet immediate release 2 tablet  2 tablet Oral 2 times per day Molt, Bethany, DO   2 tablet at 05/25/17 0851  . carvedilol (COREG) tablet 3.125 mg  3.125 mg Oral BID WC Ina Homes, MD   3.125 mg at 05/25/17 0851  . estrogens (conjugated) (PREMARIN) tablet 0.625 mg  0.625 mg Oral QHS Molt, Bethany, DO   0.625 mg at 05/24/17 2136  . hydrOXYzine (ATARAX/VISTARIL) tablet 25 mg  25 mg Oral QID Ina Homes, MD   25 mg at 05/25/17 0851  . ipratropium-albuterol (DUONEB) 0.5-2.5 (3) MG/3ML nebulizer solution 3 mL  3 mL Nebulization Q6H PRN Molt, Bethany, DO      . Melatonin TABS 6 mg  6 mg Oral QHS PRN  Molt, Bethany, DO      . ondansetron (ZOFRAN) tablet 4 mg  4 mg Oral Q6H PRN Molt, Bethany, DO       Or  . ondansetron (ZOFRAN) injection 4 mg  4 mg Intravenous Q6H PRN Molt, Bethany, DO      . pantoprazole (PROTONIX) EC tablet 40 mg  40 mg Oral Daily Molt, Bethany, DO   40 mg at 05/25/17 0851  . pravastatin (PRAVACHOL) tablet 20 mg  20 mg Oral Daily Molt, Bethany, DO   20 mg at 05/25/17 0851  . propafenone (RYTHMOL) tablet 150 mg  150 mg Oral Q12H Molt, Bethany, DO   150 mg at 05/25/17 0852  . rasagiline (AZILECT) tablet 1 mg  1 mg Oral Daily Molt, Bethany, DO   1 mg at 05/25/17 0852  . senna-docusate (Senokot-S) tablet 1 tablet  1 tablet Oral QHS PRN Molt, Bethany, DO    1 tablet at 05/24/17 0841  . sertraline (ZOLOFT) tablet 100 mg  100 mg Oral BID Molt, Bethany, DO   100 mg at 05/25/17 0851  . trimethoprim (TRIMPEX) tablet 100 mg  100 mg Oral Daily Molt, Bethany, DO   100 mg at 05/25/17 1975     Discharge Medications: Please see discharge summary for a list of discharge medications.  Relevant Imaging Results:  Relevant Lab Results:   Additional Information SSN: [id number]  Kaylee Stanford, LCSW

## 2017-05-26 NOTE — Progress Notes (Signed)
   Subjective: Patient feels OK today. She is concerned about being in a room far away from her husband at [facility]. Assured that she would be in the same facility and that visits would be allowed. She mentions to me how difficult it has been with her husbands Alzheimer disease and subsequent personality changes as well as his frequent illnesses.   Objective: Vital signs in last 24 hours: Vitals:   05/25/17 1000 05/25/17 1816 05/25/17 2111 05/26/17 0455  BP: (!) 171/67 128/60 (!) 151/66 (!) 124/59  Pulse: 63 68 66 65  Resp: 18 18 18 18   Temp: 98 F (36.7 C) 98 F (36.7 C) 98.5 F (36.9 C) 98.1 F (36.7 C)  TempSrc: Oral Oral Oral Oral  SpO2: 96% 98% 97% 95%  Weight:   133 lb 13.1 oz (60.7 kg)   Height:       General: Elderly female in no acute distress. Eating breakfast.  Pulm: Good air movement with no wheezing or crackles CV: RRR, systolic murmur RUSB. Abdomen: Active bowel sounds, soft Extremities: No LE edema Neuro: Alert and oriented, pleasant.   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 Neurology recommends lifestyle modifications and compression stockings were placed. Have ordered thigh-high compression stockings which have yet to be placed. Discussed her situation with her sister, Butch Penny, who is agreement with placement. Patient will have bed on Monday at [facility] per CSW. Appreciate their assistance in placement.  -Continue to implement conservative measures -Encourage RN to place the thigh-high stockings -Discharge to SNF tomorrow  HTN Blood pressures seem to be doing well on current regimen of Coreg 3.125mg  BID. Could consider resuming Amlodipine in the future, but at reduced dose as this medicine is associated with orthostatic hypotension. -Continue Coreg 3.125mg  BID  Parkinson's disease. - Continue carbidopa-levodopa and rasagiline.   Paroxysmal atrial fibrillation. -Currently on Eliquis for  anticoagulation, carvedilol for rate control, propafenone for rhythm control.  Anxiety, Insomnia Started on trial of Buspar yesterday with good response. She seems to be tolerating this well. She continues to have the shaking feeling in her abdomen which is chronic and I suspect is related to anxiety. She tried Ramelteon last night with OK result. Will try again tonight.  Dispo: Anticipated discharge to [facility] on Monday!  Lamonta Cypress, Romelle Starcher, DO 05/26/2017, 6:48 AM My Pager: [phone]

## 2017-05-26 NOTE — Progress Notes (Signed)
Internal Medicine Attending:   I saw and examined the patient. I reviewed Dr Rober Minion note and I agree with the resident's findings and plan as documented in the resident's note. Will need to have thigh high ted hose placed today.  Appears to be doing well with buspar and ramelteon.  Plan for [facility]/c to [facility] on Monday. Had long conversation per Le's wishes and updated sister Butch Penny.

## 2017-05-26 NOTE — Evaluation (Signed)
Occupational Therapy Evaluation Patient Details Name: Kaylee Drake MRN: [id number] DOB: [birth year] Today's Date: 05/26/2017    History of Present Illness Pt is an 84 y/o female admitted following syncopal event. CT of the head and spine negative for acute abnormality, however, did note chronic T2 compression fx. PMH inlcudes parkinsons disease, a fib, CVA, CHF, COPD, and s/p pacemaker.    Clinical Impression   PTA, pt was living at a ILF and was independent with her ADLs and facilitate assisted with IADLs. Pt currently performing ADLs at [facility] level due to decreased balance. Pt with several fall recently at home and would benefit from further OT to increase safety and decrease fall risk. Pt planning on dc to SNF tomorrow and further OT needs may be met at next venue. All acute OT needs met and will sign off.     Follow Up Recommendations  SNF    Equipment Recommendations  None recommended by OT    Recommendations for Other Services       Precautions / Restrictions Precautions Precautions: Fall Precaution Comments: Pt reporting several falls Restrictions Weight Bearing Restrictions: No      Mobility Bed Mobility Overal bed mobility: Needs Assistance Bed Mobility: Supine to Sit;Sit to Supine     Supine to sit: Supervision Sit to supine: Supervision   General bed mobility comments: Supervision for safety. Use of bed rails and elevated HOB.   Transfers Overall transfer level: Needs assistance Equipment used: None Transfers: Sit to/from Stand Sit to Stand: Min guard;Min assist         General transfer comment: Initial Min A to steady in standing. MIn guard for safety    Balance Overall balance assessment: Needs assistance Sitting-balance support: No upper extremity supported;Feet supported Sitting balance-Leahy Scale: Good     Standing balance support: No upper extremity supported;During functional activity Standing balance-Leahy Scale: Fair Standing  balance comment: Able to maintain standing balance without UE support                           ADL either performed or assessed with clinical judgement   ADL Overall ADL's : Needs assistance/impaired Eating/Feeding: Set up;Sitting   Grooming: Min guard;Oral care;Brushing hair;Standing;Wash/dry hands   Upper Body Bathing: Min guard;Sitting   Lower Body Bathing: Min guard;Sit to/from stand   Upper Body Dressing : Min guard;Sitting   Lower Body Dressing: Min guard;Sit to/from stand Lower Body Dressing Details (indicate cue type and reason): Pt able to adjust her socks at EOB Toilet Transfer: Nature conservation officer;Ambulation   Toileting- Clothing Manipulation and Hygiene: Min guard;Sit to/from stand       Functional mobility during ADLs: Min guard General ADL Comments: Pt performing near her baseline and performing ADLs at [facility] guard level for safety. Pt with decreased balance and activity tolerance     Vision Baseline Vision/History: Wears glasses Wears Glasses: Reading only Patient Visual Report: No change from baseline       Perception     Praxis      Pertinent Vitals/Pain Pain Assessment: Faces Faces Pain Scale: Hurts a little bit Pain Location: headache  Pain Descriptors / Indicators: Headache Pain Intervention(s): Monitored during session;Limited activity within patient's tolerance;Repositioned     Hand Dominance Left   Extremity/Trunk Assessment Upper Extremity Assessment Upper Extremity Assessment: Overall WFL for tasks assessed   Lower Extremity Assessment Lower Extremity Assessment: Defer to PT evaluation   Cervical / Trunk Assessment Cervical / Trunk Assessment:  Kyphotic   Communication Communication Communication: HOH   Cognition Arousal/Alertness: Awake/alert Behavior During Therapy: WFL for tasks assessed/performed Overall Cognitive Status: Within Functional Limits for tasks assessed                                      General Comments       Exercises     Shoulder Instructions      Home Living Family/patient expects to be discharged to:: Skilled nursing facility Living Arrangements: Spouse/significant other(Husband at rehab and not at pt's home) Available Help at Discharge: Family;Available PRN/intermittently Type of Home: Independent living facility Home Access: Level entry     Home Layout: One level               Home Equipment: Cane - single point          Prior Functioning/Environment Level of Independence: Needs assistance  Gait / Transfers Assistance Needed: Pt using cane for fucnitonal mobility at home ADL's / Homemaking Assistance Needed: Pt performs ADLs and light IADLs. Pt reports she is driving occasionally short distances   Comments: Facility performs IADLs including meals, cleaning, and laudry        OT Problem List: Decreased strength;Decreased range of motion;Decreased activity tolerance;Impaired balance (sitting and/or standing);Decreased knowledge of precautions;Decreased knowledge of use of DME or AE      OT Treatment/Interventions:      OT Goals(Current goals can be found in the care plan section) Acute Rehab OT Goals Patient Stated Goal: to be with her husband  OT Goal Formulation: All assessment and education complete, DC therapy  OT Frequency:     Barriers to D/C:            Co-evaluation              AM-PAC PT "6 Clicks" Daily Activity     Outcome Measure Help from another person eating meals?: None Help from another person taking care of personal grooming?: A Little Help from another person toileting, which includes using toliet, bedpan, or urinal?: A Little Help from another person bathing (including washing, rinsing, drying)?: A Little Help from another person to put on and taking off regular upper body clothing?: A Little Help from another person to put on and taking off regular lower body clothing?: A Little 6 Click Score: 19    End of Session Equipment Utilized During Treatment: Gait belt Nurse Communication: Mobility status  Activity Tolerance: Patient tolerated treatment well Patient left: in chair;with call bell/phone within reach  OT Visit Diagnosis: Unsteadiness on feet (R26.81);Other abnormalities of gait and mobility (R26.89);Muscle weakness (generalized) (M62.81)                Time: 0034-9611 OT Time Calculation (min): 29 min Charges:  OT General Charges $OT Visit: 1 Visit OT Evaluation $OT Eval Low Complexity: 1 Low OT Treatments $Self Care/Home Management : 8-22 mins G-Codes:     Alianny Toelle MSOT, OTR/L Acute Rehab Pager: [phone] Office: [place] 05/26/2017, 4:32 PM

## 2017-05-26 NOTE — Progress Notes (Addendum)
Per patients daughter, she does not want patient to be at [facility] because patient's husband is there.  Please contact Dineen Kid at [phone] However patient is alert and oriented and wishes to go to [facility]. Daughter is aware her mother makes her own decisions regarding discharge plans

## 2017-05-27 DIAGNOSIS — G4700 Insomnia, unspecified: Secondary | ICD-10-CM

## 2017-05-27 DIAGNOSIS — I10 Essential (primary) hypertension: Secondary | ICD-10-CM

## 2017-05-27 MED ORDER — BUSPIRONE HCL 5 MG PO TABS: 5.0000 mg | ORAL_TABLET | Freq: Three times a day (TID) | ORAL | 0 refills | Status: AC

## 2017-05-27 MED ORDER — CARVEDILOL 3.125 MG PO TABS: 3.1250 mg | ORAL_TABLET | Freq: Two times a day (BID) | ORAL | 0 refills | Status: DC

## 2017-05-27 NOTE — Progress Notes (Signed)
   Subjective: Patient is doing well this AM but continues to have issue with sleeping. She states that she is able to fall asleep but cannot stay asleep. We discussed trying the ramelteon a little long before trying an additional medication. She agrees. Plan to is discharge to [facility] today if a bed is available. She agrees. All questions and concerns addressed.   Objective: Vital signs in last 24 hours: Vitals:   05/26/17 0933 05/26/17 1700 05/26/17 2026 05/27/17 0448  BP: (!) 150/78 (!) 119/50 (!) 141/55 (!) 148/64  Pulse: 68 78 66 65  Resp: 18 18 18 18   Temp: 98.4 F (36.9 C) 98.2 F (36.8 C) 98.4 F (36.9 C) 97.7 F (36.5 C)  TempSrc: Oral Oral Oral Oral  SpO2: 98% 98% 97% 96%  Weight:   134 lb (60.8 kg)   Height:       General: Thin female in no acute distress Pulm: Good air movement with no wheezing or crackles  CV: RRR, no murmurs, no rubs  Abdomen: Active bowel sounds, soft, non-distended  Assessment/Plan:  Syncope Most likely related to dysautonomia secondary to her Parkinsons disease. Neurology recommends lifestyle modifications and compression stockings were placed. Have ordered thigh-high compression stockings. Will call social work today to discuss discharging the patient to [facility]. She is medically stable.  - Continue to implement conservative measures - Place compression stockings.  - Discharge today if bed is available  HTN Blood pressures seem to be doing well on current regimen of Coreg 3.125mg  BID. Could consider resuming Amlodipine in the future if standing blood pressures continue to be high.  Parkinson's disease. - Continue carbidopa-levodopa andrasagiline.   Paroxysmal atrial fibrillation. - Currently on Eliquis for anticoagulation,carvedilol for rate control, propafenone for rhythm control.  Anxiety, Insomnia - Continue BuSpar for anxiety. - Continue ramelteon for insomnia.  Dispo: Anticipated discharge today if a bed is available.    Ina Homes, MD 05/27/2017, 6:18 AM My Pager: [phone]

## 2017-05-27 NOTE — Discharge Instructions (Signed)
Thank you for allowing us to provide your care.  °

## 2017-05-27 NOTE — Discharge Summary (Signed)
Name: Kaylee Drake MRN: [id number] DOB: 1931-12-29 84 y.o. PCP: Glenda Chroman, MD  Date of Admission: 05/23/2017  1:22 PM Date of Discharge:  Attending Physician: Oda Kilts, MD  Discharge Diagnosis: 1. Recurrent Syncope  2. Hypertension  3. Anxiety / Insomnia  4. Parkinson's Disease  Discharge Medications: Allergies as of 05/27/2017      Reactions   Codeine Itching   Severe itching   Adhesive [tape] Rash, Other (See Comments)   Tears the skin!! (also)   Latex Rash      Medication List    STOP taking these medications   amLODipine 5 MG tablet Commonly known as:  NORVASC     TAKE these medications   acetaminophen 325 MG tablet Commonly known as:  TYLENOL Take 325-650 mg by mouth every 6 (six) hours as needed (for headaches).   alendronate 70 MG tablet Commonly known as:  FOSAMAX Take 70 mg by mouth every Wednesday. Take with a full glass of water on an empty stomach.   apixaban 2.5 MG Tabs tablet Commonly known as:  ELIQUIS Take 1 tablet (2.5 mg total) by mouth 2 (two) times daily.   aspirin EC 81 MG tablet Take 81 mg by mouth daily.   AZILECT 1 MG Tabs tablet Generic drug:  rasagiline Take 1 mg by mouth daily.   busPIRone 5 MG tablet Commonly known as:  BUSPAR Take 1 tablet (5 mg total) by mouth 3 (three) times daily.   carbidopa-levodopa 25-100 MG tablet Commonly known as:  SINEMET IR Take 2 tablets by mouth See admin instructions. 2 tablets two times a day/9 AM and 9 PM   carvedilol 3.125 MG tablet Commonly known as:  COREG Take 1 tablet (3.125 mg total) by mouth 2 (two) times daily with a meal. What changed:    medication strength  how much to take  when to take this   cetirizine 10 MG tablet Commonly known as:  ZYRTEC Take 10 mg by mouth daily as needed for allergies.   estrogens (conjugated) 0.625 MG tablet Commonly known as:  PREMARIN Take 0.625 mg by mouth at bedtime.   ipratropium-albuterol 0.5-2.5 (3) MG/3ML  Soln Commonly known as:  DUONEB Take 3 mLs by nebulization every 6 (six) hours as needed (for shortness of breath).   Melatonin 5 MG Tabs Take 5 mg by mouth at bedtime as needed (for sleep).   nitroGLYCERIN 0.4 MG SL tablet Commonly known as:  NITROSTAT Place 0.4 mg under the tongue every 5 (five) minutes as needed for chest pain (MAX 3 TABLETS IN 15 MINS).   pantoprazole 40 MG tablet Commonly known as:  PROTONIX Take 40 mg by mouth daily.   potassium chloride 10 MEQ tablet Commonly known as:  K-DUR Take 10 mEq by mouth daily.   pravastatin 20 MG tablet Commonly known as:  PRAVACHOL Take 20 mg by mouth daily.   propafenone 150 MG tablet Commonly known as:  RYTHMOL Take 150 mg by mouth every 12 (twelve) hours.   sertraline 100 MG tablet Commonly known as:  ZOLOFT Take 2 tablets (200 mg total) by mouth daily. What changed:    how much to take  when to take this   trimethoprim 100 MG tablet Commonly known as:  TRIMPEX Take 100 mg by mouth daily.   Vitamin D (Ergocalciferol) 50000 units Caps capsule Commonly known as:  DRISDOL Take 50,000 Units by mouth every Wednesday.     Disposition and follow-up:   [name]  was discharged from [facility] in Stable condition.  At the hospital follow up visit please address:  1.  Patient stable for discharge. Her recurrent syncope is felt to be secondary to autonomic dysfunction due to her Parkinson's disease.  We recommend only treating standing blood pressures.  Her amlodipine was discontinued during this hospitalization, but can be restarted at a lower dose if her standing blood pressures remain elevated.  Please ensure that she follows up with neurology on January 29.  For her anxiety continue BuSpar.  2.  Labs / imaging needed at time of follow-up: None  3.  Pending labs/ test needing follow-up: None  Follow-up Appointments: Follow-up Information    Vyas, Costella Hatcher, MD Follow up.   Specialty:   Internal Medicine Contact information: [place] [id number] [place] Hospital Course by problem list:   Recurrent Syncope.  Kaylee Drake is an 84 year old female with atrial fibrillation status post pacemaker placement, HFpEF, Parkinson's disease, and recurrent syncope who presented to the ED after a syncopal episode.  She has required multiple ED evaluations and hospitalizations for recurrent syncope over the prior 5 years.  At this point the most likely etiology for her recurrent syncope is autonomic dysfunction secondary to her Parkinson's disease.  Neurology was consulted who felt that lifestyle modifications should be attempted prior to medication use.  Compression stockings were placed.  Blood pressure medications were decreased/held (see below for complete description).  Patient has outpatient neurology visit scheduled on January 29.  Hypertension.  Patient has a history of hypertension.  She was previously on amlodipine and carvedilol.  Her amlodipine was discontinued on this hospitalization, and her Coreg was decreased to 3.125 mg twice daily.  She tolerated these changes well.  Of note we believe that her recurrent syncope is secondary to autonomic dysfunction and would recommend only treating her standing blood pressures.  Anxiety / Insomnia.  Patient has multiple life stressors and findings consistent with generalized anxiety disorder.  She was previously on Xanax 4 times daily; however, given her recurrent syncope and falls we felt it best to discontinue all medications that could precipitate a fall.  She was transitioned to BuSpar, and tolerated it well.  She was given ramelteon to help her sleep.  Of note we did try hydroxyzine in the hospital, however it made the patient very somnolent.  Parkinson's Disease.  We did not change the patient's medications for her Parkinson's disease on this hospitalization.  She will follow-up with neurology on January  29.  Discharge Vitals:   BP (!) 145/67 (BP Location: Left Arm)   Pulse 69   Temp 97.8 F (36.6 C) (Oral)   Resp 17   Ht 5\' 2"  (1.575 m)   Wt 134 lb (60.8 kg)   SpO2 98%   BMI 24.51 kg/m   Pertinent Labs, Studies, and Procedures:   Carotid Doppler Right Carotid: There is evidence in the right ICA of a 1-39% stenosis. Left Carotid: There is evidence in the left ICA of a 1-39% stenosis. Sluggish       flow noted in dilated area of left IVJ. Vertebrals: Both vertebral arteries were patent with antegrade flow.  Discharge Instructions: Discharge Instructions    Diet - low sodium heart healthy   Complete by:  As directed    Increase activity slowly   Complete by:  As directed      Signed: Ina Homes, MD 05/27/2017, 12:26 PM  My Pager: [phone]

## 2017-05-27 NOTE — Care Management Important Message (Signed)
Important Message  Patient Details  Name: Kaylee Drake MRN: [id number] Date of Birth: 1931-07-07   Medicare Important Message Given:  Yes    [name] 05/27/2017, 1:50 PM

## 2017-05-27 NOTE — Progress Notes (Signed)
Patient is requesting anti-anxiety medication. No current PRN orders. MD notified and made aware. Will continue to monitor.

## 2017-05-28 DIAGNOSIS — I110 Hypertensive heart disease with heart failure: Secondary | ICD-10-CM | POA: Diagnosis not present

## 2017-05-28 DIAGNOSIS — M6281 Muscle weakness (generalized): Secondary | ICD-10-CM | POA: Diagnosis not present

## 2017-05-28 DIAGNOSIS — R42 Dizziness and giddiness: Secondary | ICD-10-CM | POA: Diagnosis not present

## 2017-05-28 DIAGNOSIS — I480 Paroxysmal atrial fibrillation: Secondary | ICD-10-CM | POA: Diagnosis not present

## 2017-05-28 DIAGNOSIS — E7849 Other hyperlipidemia: Secondary | ICD-10-CM | POA: Diagnosis not present

## 2017-05-28 DIAGNOSIS — Z87440 Personal history of urinary (tract) infections: Secondary | ICD-10-CM | POA: Diagnosis not present

## 2017-05-28 DIAGNOSIS — D518 Other vitamin B12 deficiency anemias: Secondary | ICD-10-CM | POA: Diagnosis not present

## 2017-05-28 DIAGNOSIS — M810 Age-related osteoporosis without current pathological fracture: Secondary | ICD-10-CM | POA: Diagnosis not present

## 2017-05-28 DIAGNOSIS — Z79899 Other long term (current) drug therapy: Secondary | ICD-10-CM | POA: Diagnosis not present

## 2017-05-28 DIAGNOSIS — Z885 Allergy status to narcotic agent status: Secondary | ICD-10-CM | POA: Diagnosis not present

## 2017-05-28 DIAGNOSIS — F419 Anxiety disorder, unspecified: Secondary | ICD-10-CM | POA: Diagnosis not present

## 2017-05-28 DIAGNOSIS — G20 Parkinson's disease: Secondary | ICD-10-CM | POA: Diagnosis not present

## 2017-05-28 DIAGNOSIS — Z91040 Latex allergy status: Secondary | ICD-10-CM

## 2017-05-28 DIAGNOSIS — Z950 Presence of cardiac pacemaker: Secondary | ICD-10-CM | POA: Diagnosis not present

## 2017-05-28 DIAGNOSIS — R55 Syncope and collapse: Secondary | ICD-10-CM | POA: Diagnosis not present

## 2017-05-28 DIAGNOSIS — I4892 Unspecified atrial flutter: Secondary | ICD-10-CM | POA: Diagnosis not present

## 2017-05-28 DIAGNOSIS — I481 Persistent atrial fibrillation: Secondary | ICD-10-CM | POA: Diagnosis not present

## 2017-05-28 DIAGNOSIS — H6123 Impacted cerumen, bilateral: Secondary | ICD-10-CM | POA: Diagnosis not present

## 2017-05-28 DIAGNOSIS — I639 Cerebral infarction, unspecified: Secondary | ICD-10-CM | POA: Diagnosis not present

## 2017-05-28 DIAGNOSIS — R269 Unspecified abnormalities of gait and mobility: Secondary | ICD-10-CM | POA: Diagnosis not present

## 2017-05-28 DIAGNOSIS — I10 Essential (primary) hypertension: Secondary | ICD-10-CM | POA: Diagnosis not present

## 2017-05-28 DIAGNOSIS — E785 Hyperlipidemia, unspecified: Secondary | ICD-10-CM | POA: Diagnosis not present

## 2017-05-28 DIAGNOSIS — I959 Hypotension, unspecified: Secondary | ICD-10-CM | POA: Diagnosis not present

## 2017-05-28 DIAGNOSIS — K219 Gastro-esophageal reflux disease without esophagitis: Secondary | ICD-10-CM | POA: Diagnosis not present

## 2017-05-28 DIAGNOSIS — E119 Type 2 diabetes mellitus without complications: Secondary | ICD-10-CM | POA: Diagnosis not present

## 2017-05-28 DIAGNOSIS — Z7901 Long term (current) use of anticoagulants: Secondary | ICD-10-CM | POA: Diagnosis not present

## 2017-05-28 DIAGNOSIS — R2681 Unsteadiness on feet: Secondary | ICD-10-CM | POA: Diagnosis not present

## 2017-05-28 DIAGNOSIS — Z91048 Other nonmedicinal substance allergy status: Secondary | ICD-10-CM

## 2017-05-28 DIAGNOSIS — R112 Nausea with vomiting, unspecified: Secondary | ICD-10-CM | POA: Diagnosis not present

## 2017-05-28 DIAGNOSIS — I5030 Unspecified diastolic (congestive) heart failure: Secondary | ICD-10-CM | POA: Diagnosis not present

## 2017-05-28 DIAGNOSIS — F331 Major depressive disorder, recurrent, moderate: Secondary | ICD-10-CM | POA: Diagnosis not present

## 2017-05-28 DIAGNOSIS — R262 Difficulty in walking, not elsewhere classified: Secondary | ICD-10-CM | POA: Diagnosis not present

## 2017-05-28 DIAGNOSIS — R079 Chest pain, unspecified: Secondary | ICD-10-CM | POA: Diagnosis not present

## 2017-05-28 DIAGNOSIS — G4700 Insomnia, unspecified: Secondary | ICD-10-CM | POA: Diagnosis not present

## 2017-05-28 NOTE — Progress Notes (Signed)
Report called and given to nurse at [facility].

## 2017-05-28 NOTE — Progress Notes (Signed)
  Date: 05/27/2017  Patient name: Kaylee Drake  Medical record number: [id number]  Date of birth: Feb 12, 1932   I have seen and evaluated this patient and I have discussed the plan of care with the house staff. Please see their note for complete details. I concur with their findings with the following additions/corrections:   Over the weekend, altered some of her medications including adding BuSpar for anxiety and ramelteon for sleep. Seems to be doing well today, but emotional when discussing her husband's health. She is ready for discharge as soon as we can find a safe discharge plan for her, which will be at a SNF.  Please note that this attestation as filed late for service I personally performed on 05/27/2017.  Oda Kilts, MD 05/28/2017, 2:18 PM

## 2017-05-28 NOTE — Clinical Social Work Note (Signed)
PASSRR number rec'd for patient - [id number]. Patient will discharge to [facility] today, transported by ambulance. Per Mrs. Philbin, her daughter does not have to be contacted.  Orra Nolde Givens, MSW, LCSW Licensed Clinical Social Worker [place] [phone]

## 2017-05-28 NOTE — Progress Notes (Signed)
   Subjective: Patient very upset this morning.  States that her husband has been moved from [facility] to [place].  She is requesting not to go to [facility] now.  Has already spoke to social work who is arranging admission to another facility.  Patient is very upset about not being able to visit her husband.  Spoke with both the patient and her husband over the phone.  They agree with the patient going to a skilled nursing facility for rehab.  We will proceed with placement.  All questions and concerns addressed.  Objective: Vital signs in last 24 hours: Vitals:   05/27/17 0448 05/27/17 0836 05/27/17 1708 05/27/17 2143  BP: (!) 148/64 (!) 145/67 (!) 171/75 118/75  Pulse: 65 69 63 79  Resp: 18 17 18 17   Temp: 97.7 F (36.5 C) 97.8 F (36.6 C) 97.9 F (36.6 C) 98.5 F (36.9 C)  TempSrc: Oral Oral Oral Oral  SpO2: 96% 98% 100% 98%  Weight:      Height:       General: Thin female in no acute distress Pulm: Good air movement with no wheezing or crackles CV: Regular rate and rhythm, no murmurs, no rubs Abdomen: Active bowel sounds, soft, nondistended  Assessment/Plan:  Syncope Most likely related to dysautonomia secondary to her Parkinsons disease.Neurology recommends lifestyle modifications and compression stockings were placed. Have ordered thigh-high compression stockings. She continues to be medically stable and is ready for discharge once transportation is arranged.  - Continue to implement conservative measures - Have requested that nursing place the patient's compression stockings.  - Discharge today if bed is available  HTN Blood pressuresseem to be doing well on current regimen of Coreg 3.125mg  BID. Could consider resuming Amlodipinein the future if standing blood pressures continue to be high.  Parkinson's disease. - Continue carbidopa-levodopa andrasagiline.   Paroxysmal atrial fibrillation. - Currently on Eliquis for anticoagulation,carvedilol for rate control,  propafenone for rhythm control.  Anxiety, Insomnia - Continue BuSpar for anxiety. - Continue ramelteon for insomnia.  Dispo: Anticipated discharge today.   Ina Homes, MD 05/28/2017, 5:46 AM My Pager: [phone]

## 2017-05-28 NOTE — Clinical Social Work Placement (Signed)
   CLINICAL SOCIAL WORK PLACEMENT  NOTE 05/28/17 - DISCHARGED TO [facility] BY [facility]BULANCE   Date:  05/28/2017  Patient Details  Name: RORIE DELMORE MRN: [id number] Date of Birth: 1932-01-03  Clinical Social Work is seeking post-discharge placement for this patient at the [place] level of care (*CSW will initial, date and re-position this form in  chart as items are completed):  No(Patient expressed facility preference)   Patient/family provided with [facility] Work Department's list of facilities offering this level of care within the geographic area requested by the patient (or if unable, by the patient's family).  Yes   Patient/family informed of their freedom to choose among providers that offer the needed level of care, that participate in Medicare, Medicaid or managed care program needed by the patient, have an available bed and are willing to accept the patient.  No   Patient/family informed of [facility] [facility]'s ownership interest in [facility] and [facility], as well as of the fact that they are under no obligation to receive care at these facilities.  PASRR submitted to EDS on 05/25/17     PASRR number received on 05/28/17     Existing PASRR number confirmed on       FL2 transmitted to all facilities in geographic area requested by pt/family on 05/25/17     FL2 transmitted to all facilities within larger geographic area on       Patient informed that his/her managed care company has contracts with or will negotiate with certain facilities, including the following:        Yes   Patient/family informed of bed offers received.  Patient chooses bed at [facility]     Physician recommends and patient chooses bed at      Patient to be transferred to [facility] on 05/28/17.  Patient to be transferred to facility by Ambulance     Patient family notified on 05/28/17 of transfer.  Name of family member notified:   Patient talked with her husband by phone and daughter aware of patient's discharge     PHYSICIAN       Additional Comment:    _______________________________________________ Sable Feil, LCSW 05/28/2017, 2:39 PM

## 2017-05-28 NOTE — Progress Notes (Signed)
Kaylee Drake to be D/C'd Skilled nursing facility per MD order.  Discussed prescriptions and follow up appointments with the patient. Prescriptions given to patient, medication list explained in detail. Pt verbalized understanding.  Allergies as of 05/28/2017      Reactions   Codeine Itching   Severe itching   Adhesive [tape] Rash, Other (See Comments)   Tears the skin!! (also)   Latex Rash      Medication List    STOP taking these medications   amLODipine 5 MG tablet Commonly known as:  NORVASC     TAKE these medications   acetaminophen 325 MG tablet Commonly known as:  TYLENOL Take 325-650 mg by mouth every 6 (six) hours as needed (for headaches).   alendronate 70 MG tablet Commonly known as:  FOSAMAX Take 70 mg by mouth every Wednesday. Take with a full glass of water on an empty stomach.   apixaban 2.5 MG Tabs tablet Commonly known as:  ELIQUIS Take 1 tablet (2.5 mg total) by mouth 2 (two) times daily.   aspirin EC 81 MG tablet Take 81 mg by mouth daily.   AZILECT 1 MG Tabs tablet Generic drug:  rasagiline Take 1 mg by mouth daily.   busPIRone 5 MG tablet Commonly known as:  BUSPAR Take 1 tablet (5 mg total) by mouth 3 (three) times daily.   carbidopa-levodopa 25-100 MG tablet Commonly known as:  SINEMET IR Take 2 tablets by mouth See admin instructions. 2 tablets two times a day/9 AM and 9 PM   carvedilol 3.125 MG tablet Commonly known as:  COREG Take 1 tablet (3.125 mg total) by mouth 2 (two) times daily with a meal. What changed:    medication strength  how much to take  when to take this   cetirizine 10 MG tablet Commonly known as:  ZYRTEC Take 10 mg by mouth daily as needed for allergies.   estrogens (conjugated) 0.625 MG tablet Commonly known as:  PREMARIN Take 0.625 mg by mouth at bedtime.   ipratropium-albuterol 0.5-2.5 (3) MG/3ML Soln Commonly known as:  DUONEB Take 3 mLs by nebulization every 6 (six) hours as needed (for shortness of  breath).   Melatonin 5 MG Tabs Take 5 mg by mouth at bedtime as needed (for sleep).   nitroGLYCERIN 0.4 MG SL tablet Commonly known as:  NITROSTAT Place 0.4 mg under the tongue every 5 (five) minutes as needed for chest pain (MAX 3 TABLETS IN 15 MINS).   pantoprazole 40 MG tablet Commonly known as:  PROTONIX Take 40 mg by mouth daily.   potassium chloride 10 MEQ tablet Commonly known as:  K-DUR Take 10 mEq by mouth daily.   pravastatin 20 MG tablet Commonly known as:  PRAVACHOL Take 20 mg by mouth daily.   propafenone 150 MG tablet Commonly known as:  RYTHMOL Take 150 mg by mouth every 12 (twelve) hours.   sertraline 100 MG tablet Commonly known as:  ZOLOFT Take 2 tablets (200 mg total) by mouth daily. What changed:    how much to take  when to take this   trimethoprim 100 MG tablet Commonly known as:  TRIMPEX Take 100 mg by mouth daily.   Vitamin D (Ergocalciferol) 50000 units Caps capsule Commonly known as:  DRISDOL Take 50,000 Units by mouth every Wednesday.            Durable Medical Equipment  (From admission, onward)        Start     Ordered  05/28/17 0000  For home use only DME Walker rolling    Question:  Patient needs a walker to treat with the following condition  Answer:  Impaired ambulation   05/28/17 1311      Vitals:   05/28/17 0808 05/28/17 1555  BP: (!) 146/53 133/66  Pulse: 65 68  Resp: 16 18  Temp: 97.8 F (36.6 C) 98.3 F (36.8 C)  SpO2: 97% 96%    Skin clean, dry and intact without evidence of skin break down, no evidence of skin tears noted. IV catheter discontinued intact. Site without signs and symptoms of complications. Dressing and pressure applied. Pt denies pain at this time. No complaints noted.  An After Visit Summary was printed and given to the patient. Patient escorted via stretcher, and D/C to [facility] via [place].  Dixie Dials RN, BSN

## 2017-05-28 NOTE — Progress Notes (Signed)
Physical Therapy Treatment Patient Details Name: Kaylee Drake MRN: [id number] DOB: 05-May-1932 Today's Date: 05/28/2017    History of Present Illness Pt is an 84 y/o female admitted following syncopal event. CT of the head and spine negative for acute abnormality, however, did note chronic T2 compression fx. PMH inlcudes parkinsons disease, a fib, CVA, CHF, COPD, and s/p pacemaker.     PT Comments    Patient seen for mobility progression. Patient remains weak and anxious with mobility. Increased risk for fall and concerns regarding activity tolerance. Current POC remains appropriate.   Follow Up Recommendations  SNF;Supervision/Assistance - 24 hour     Equipment Recommendations  Rolling walker with 5" wheels    Recommendations for Other Services       Precautions / Restrictions Precautions Precautions: Fall Precaution Comments: Pt reporting several falls Restrictions Weight Bearing Restrictions: No    Mobility  Bed Mobility Overal bed mobility: Needs Assistance Bed Mobility: Supine to Sit     Supine to sit: Supervision     General bed mobility comments: supervision for safety  Transfers Overall transfer level: Needs assistance Equipment used: None Transfers: Sit to/from Stand Sit to Stand: Min assist         General transfer comment: min assist for stability to elevate to upright  Ambulation/Gait Ambulation/Gait assistance: Min assist Ambulation Distance (Feet): 22 Feet Assistive device: 1 person hand held assist Gait Pattern/deviations: Step-through pattern;Decreased stride length;Drifts right/left Gait velocity: decreased Gait velocity interpretation: Below normal speed for age/gender General Gait Details: patient unsteady with mobility, patient requires increased assist to maintain stability, patient very anxious with mobiilty and fear of falling   Stairs            Wheelchair Mobility    Modified Rankin (Stroke Patients Only)        Balance Overall balance assessment: Needs assistance Sitting-balance support: No upper extremity supported;Feet supported Sitting balance-Leahy Scale: Good     Standing balance support: No upper extremity supported;During functional activity Standing balance-Leahy Scale: Fair Standing balance comment: Able to maintain standing balance without UE support                            Cognition Arousal/Alertness: Awake/alert Behavior During Therapy: WFL for tasks assessed/performed Overall Cognitive Status: Within Functional Limits for tasks assessed                                        Exercises      General Comments General comments (skin integrity, edema, etc.): assisted patient with functional task performance at EOB       Pertinent Vitals/Pain Pain Assessment: Faces Faces Pain Scale: Hurts a little bit Pain Location: headache  Pain Descriptors / Indicators: Headache Pain Intervention(s): Monitored during session    Home Living                      Prior Function            PT Goals (current goals can now be found in the care plan section) Acute Rehab PT Goals Patient Stated Goal: to be with her husband  PT Goal Formulation: With patient Time For Goal Achievement: 06/07/17 Potential to Achieve Goals: Good Progress towards PT goals: Progressing toward goals    Frequency    Min 2X/week      PT Plan Current  plan remains appropriate    Co-evaluation              AM-PAC PT "6 Clicks" Daily Activity  Outcome Measure  Difficulty turning over in bed (including adjusting bedclothes, sheets and blankets)?: A Little Difficulty moving from lying on back to sitting on the side of the bed? : A Little Difficulty sitting down on and standing up from a chair with arms (e.g., wheelchair, bedside commode, etc,.)?: Unable Help needed moving to and from a bed to chair (including a wheelchair)?: A Little Help needed walking in  hospital room?: A Little Help needed climbing 3-5 steps with a railing? : A Lot 6 Click Score: 15    End of Session Equipment Utilized During Treatment: Gait belt Activity Tolerance: Patient tolerated treatment well Patient left: in chair;with call bell/phone within reach;with family/visitor present Nurse Communication: Mobility status PT Visit Diagnosis: Unsteadiness on feet (R26.81);Other abnormalities of gait and mobility (R26.89);Other symptoms and signs involving the nervous system (R29.898);History of falling (Z91.81);Repeated falls (R29.6);Muscle weakness (generalized) (M62.81)     Time: 8144-8185 PT Time Calculation (min) (ACUTE ONLY): 19 min  Charges:  $Therapeutic Activity: 8-22 mins                    G Codes:       Alben Deeds, PT DPT  Board Certified Neurologic Specialist [place] 05/28/2017, 9:02 AM

## 2017-05-29 ENCOUNTER — Ambulatory Visit (HOSPITAL_COMMUNITY): Payer: Self-pay | Admitting: Psychiatry

## 2017-05-29 DIAGNOSIS — G20 Parkinson's disease: Secondary | ICD-10-CM | POA: Diagnosis not present

## 2017-05-29 DIAGNOSIS — I10 Essential (primary) hypertension: Secondary | ICD-10-CM | POA: Diagnosis not present

## 2017-05-29 DIAGNOSIS — I481 Persistent atrial fibrillation: Secondary | ICD-10-CM | POA: Diagnosis not present

## 2017-05-29 DIAGNOSIS — R55 Syncope and collapse: Secondary | ICD-10-CM | POA: Diagnosis not present

## 2017-05-30 ENCOUNTER — Ambulatory Visit: Payer: Self-pay | Admitting: Cardiovascular Disease

## 2017-05-30 DIAGNOSIS — F419 Anxiety disorder, unspecified: Secondary | ICD-10-CM | POA: Diagnosis not present

## 2017-06-01 DIAGNOSIS — I10 Essential (primary) hypertension: Secondary | ICD-10-CM | POA: Diagnosis not present

## 2017-06-01 DIAGNOSIS — F331 Major depressive disorder, recurrent, moderate: Secondary | ICD-10-CM | POA: Diagnosis not present

## 2017-06-01 DIAGNOSIS — G20 Parkinson's disease: Secondary | ICD-10-CM | POA: Diagnosis not present

## 2017-06-01 DIAGNOSIS — R55 Syncope and collapse: Secondary | ICD-10-CM | POA: Diagnosis not present

## 2017-06-05 DIAGNOSIS — I10 Essential (primary) hypertension: Secondary | ICD-10-CM | POA: Diagnosis not present

## 2017-06-05 DIAGNOSIS — R112 Nausea with vomiting, unspecified: Secondary | ICD-10-CM | POA: Diagnosis not present

## 2017-06-05 DIAGNOSIS — R55 Syncope and collapse: Secondary | ICD-10-CM | POA: Diagnosis not present

## 2017-06-10 DIAGNOSIS — Z79899 Other long term (current) drug therapy: Secondary | ICD-10-CM | POA: Diagnosis not present

## 2017-06-10 DIAGNOSIS — E119 Type 2 diabetes mellitus without complications: Secondary | ICD-10-CM | POA: Diagnosis not present

## 2017-06-11 DIAGNOSIS — I481 Persistent atrial fibrillation: Secondary | ICD-10-CM | POA: Diagnosis not present

## 2017-06-11 DIAGNOSIS — R55 Syncope and collapse: Secondary | ICD-10-CM | POA: Diagnosis not present

## 2017-06-11 DIAGNOSIS — I10 Essential (primary) hypertension: Secondary | ICD-10-CM | POA: Diagnosis not present

## 2017-06-11 DIAGNOSIS — G20 Parkinson's disease: Secondary | ICD-10-CM | POA: Diagnosis not present

## 2017-06-18 ENCOUNTER — Encounter (INDEPENDENT_AMBULATORY_CARE_PROVIDER_SITE_OTHER): Payer: Self-pay

## 2017-06-18 ENCOUNTER — Ambulatory Visit (INDEPENDENT_AMBULATORY_CARE_PROVIDER_SITE_OTHER): Payer: Medicare Other | Admitting: Neurology

## 2017-06-18 ENCOUNTER — Encounter: Payer: Self-pay | Admitting: Neurology

## 2017-06-18 VITALS — BP 154/81 | HR 65 | Ht 62.0 in | Wt 131.8 lb

## 2017-06-18 DIAGNOSIS — R42 Dizziness and giddiness: Secondary | ICD-10-CM | POA: Diagnosis not present

## 2017-06-18 DIAGNOSIS — R269 Unspecified abnormalities of gait and mobility: Secondary | ICD-10-CM | POA: Diagnosis not present

## 2017-06-18 MED ORDER — PYRIDOSTIGMINE BROMIDE 60 MG PO TABS: ORAL_TABLET | ORAL | 1 refills | Status: AC

## 2017-06-18 NOTE — Progress Notes (Signed)
PATIENT: Kaylee Drake DOB: 08/19/1931  Chief Complaint  Patient presents with  . Recurrent Syncope    Orthostatic Vitals:  Lying: 154/81, 65, Sitting: 134/80, 76, Standing: 110/66, 78, Standing x 3 minutes: 129/80, 80. She resides at the [facility] in [place].  She is here with her granddaughter, Lenna Sciara, for ED follow up for syncope events.  Marland Kitchen PCP    Glenda Chroman, MD     HISTORICAL  Kaylee Drake is a 84 year old female, accompanied by her granddaughter Lenna Sciara, seen in refer by primary care doctor Jerene Bears B, for evaluation of dizziness, initial evaluation was on June 18, 2017.  She has past medical history of hypertension, hyperlipidemia, atrial fibrillation, taking Eliquis 2.5 mg twice a day, also had mild gait abnormality, she was diagnosed with Parkinson's disease by neurologist at [facility] since 2014, is now taking sentiment 25/100 mg 2 tablets twice a day, Azilect 1 mg daily.  She did not notice significant change.  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, who suffered dementia, often has angry outbursts, she does has depression, taking polypharmacy, this improved BuSpar 5 mg 3 times a day, Zoloft 50 mg daily,  She began to have frequent dizziness, occasionally passing out spells since 2012, initially dizziness spells are mild, when she got up quickly, she felt transient lightheadedness, improved by holding still, or lying down, her dizziness spells gradually getting worse, to the point when she was working at her kitchen, after standing for a while, she felt heart palpitation, lightheadedness, had a few pass out episode,  She was diagnosed with orthostatic hypotension, there was frequent blood pressure measurement at her current facility [facility] in [place], but I was not able to interpret whether she has significant orthostatic blood pressure changes,  She moved from home to [facility] in 2018, she fell multiple times since December 2018,  was admitted to the hospital in January 2019, was diagnosed with autonomic dysfunction, adjustment of her blood pressure medications, amlodipine was discontinued, Coreg was decreased to 3.125 mg daily, for anxiety, and insomnia, she was taking Xanax 4 times daily, it was stopped, was changed to BuSpar 5 mg 3 times a day,  On today's examination, she does show significant orthostatic hypotension, lying down blood pressure 154/81, heart rate of 65, standing up 110/66, heart rate of 78,  REVIEW OF SYSTEMS: Full 14 system review of systems performed and notable only for palpitation, shortness of breath, hearing loss, anemia, memory loss, confusion, headache, dizziness, passing out, depression, anxiety, decreased energy, disinterested in activities, shortness of breath, rash, itching.  ALLERGIES: Allergies  Allergen Reactions  . Codeine Itching    Severe itching  . Adhesive [Tape] Rash and Other (See Comments)    Tears the skin!! (also)  . Latex Rash    HOME MEDICATIONS: Current Outpatient Medications  Medication Sig Dispense Refill  . acetaminophen (TYLENOL) 325 MG tablet Take 325-650 mg by mouth every 6 (six) hours as needed (for headaches).    Marland Kitchen alendronate (FOSAMAX) 70 MG tablet Take 70 mg by mouth every Wednesday. Take with a full glass of water on an empty stomach.     Marland Kitchen apixaban (ELIQUIS) 2.5 MG TABS tablet Take 1 tablet (2.5 mg total) by mouth 2 (two) times daily. 180 tablet 3  . aspirin EC 81 MG tablet Take 81 mg by mouth daily.    . busPIRone (BUSPAR) 5 MG tablet Take 1 tablet (5 mg total) by mouth 3 (three) times  daily. 90 tablet 0  . carbidopa-levodopa (SINEMET IR) 25-100 MG tablet Take 2 tablets by mouth See admin instructions. 2 tablets two times a day/9 AM and 9 PM    . carvedilol (COREG) 6.25 MG tablet Take 6.25 mg by mouth 2 (two) times daily with a meal.    . cetirizine (ZYRTEC) 10 MG tablet Take 10 mg by mouth daily as needed for allergies.     Marland Kitchen docusate sodium (COLACE) 100  MG capsule Take 100 mg by mouth 2 (two) times daily.    Marland Kitchen estrogens, conjugated, (PREMARIN) 0.625 MG tablet Take 0.625 mg by mouth at bedtime.     Marland Kitchen ipratropium-albuterol (DUONEB) 0.5-2.5 (3) MG/3ML SOLN Take 3 mLs by nebulization every 6 (six) hours as needed (for shortness of breath).     Marland Kitchen lisinopril (PRINIVIL,ZESTRIL) 20 MG tablet Take 20 mg by mouth daily.    Marland Kitchen MELATONIN PO Take 6 mg by mouth.    . mirtazapine (REMERON) 7.5 MG tablet Take 7.5 mg by mouth at bedtime.    . nitroGLYCERIN (NITROSTAT) 0.4 MG SL tablet Place 0.4 mg under the tongue every 5 (five) minutes as needed for chest pain (MAX 3 TABLETS IN 15 MINS).     Marland Kitchen pantoprazole (PROTONIX) 40 MG tablet Take 40 mg by mouth daily.    . polyethylene glycol (MIRALAX / GLYCOLAX) packet Take 17 g by mouth daily.    . potassium chloride (K-DUR) 10 MEQ tablet Take 10 mEq by mouth daily.    . pravastatin (PRAVACHOL) 20 MG tablet Take 20 mg by mouth daily.    . promethazine (PHENERGAN) 25 MG tablet Take 25 mg by mouth every 6 (six) hours as needed for nausea or vomiting.    . propafenone (RYTHMOL) 150 MG tablet Take 150 mg by mouth every 12 (twelve) hours.     . rasagiline (AZILECT) 1 MG TABS Take 1 mg by mouth daily.    . sertraline (ZOLOFT) 50 MG tablet Take 50 mg by mouth daily.    Marland Kitchen trimethoprim (TRIMPEX) 100 MG tablet Take 100 mg by mouth daily.     No current facility-administered medications for this visit.     PAST MEDICAL HISTORY: Past Medical History:  Diagnosis Date  . CHF (congestive heart failure) ([place])   . COPD (chronic obstructive pulmonary disease) ([place])   . Parkinson's disease ([place])   . Paroxysmal atrial fibrillation ([facility])    afib  . Stroke ([place])   . Syncope     PAST SURGICAL HISTORY: Past Surgical History:  Procedure Laterality Date  . CHOLECYSTECTOMY    . COLON SURGERY    . OOPHORECTOMY    . PACEMAKER IMPLANT  09/07/2010   [employer] [facility] [address] implanted by Dr Sharyon Cable    FAMILY HISTORY: Family History    Problem Relation Age of Onset  . Stroke Mother   . Other Father        accident - ran over by drunk driver  . Heart disease Maternal Grandmother   . Cancer Maternal Grandfather   . Stroke Paternal Grandmother   . Heart disease Paternal Grandfather     SOCIAL HISTORY:  Social History   Socioeconomic History  . Marital status: Married    Spouse name: Not on file  . Number of children: 3  . Years of education: 1 year college  . Highest education level: Not on file  Social Needs  . Financial resource strain: Not on file  . Food insecurity - worry: Not on  file  . Food insecurity - inability: Not on file  . Transportation needs - medical: Not on file  . Transportation needs - non-medical: Not on file  Occupational History  . Occupation: Retired  Tobacco Use  . Smoking status: Never Smoker  . Smokeless tobacco: Never Used  Substance and Sexual Activity  . Alcohol use: No  . Drug use: No  . Sexual activity: Not on file  Other Topics Concern  . Not on file  Social History Narrative   Resides at the [facility] in [place].   Left-handed.   2 glass of tea each day.     PHYSICAL EXAM   Vitals:   06/18/17 1016  BP: (!) 154/81  Pulse: 65  Weight: 131 lb 12 oz (59.8 kg)  Height: 5\' 2"  (1.575 m)    Not recorded      Body mass index is 24.1 kg/m.  PHYSICAL EXAMNIATION:  Gen: NAD, conversant, well nourised, obese, well groomed                     Cardiovascular: Regular rate rhythm, no peripheral edema, warm, nontender. Eyes: Conjunctivae clear without exudates or hemorrhage Neck: Supple, no carotid bruits. Pulmonary: Clear to auscultation bilaterally   NEUROLOGICAL EXAM:  MENTAL STATUS: Speech:    Speech is normal; fluent and spontaneous with normal comprehension.  Cognition:     Orientation to time, place and person     Normal recent and remote memory     Normal Attention span and concentration     Normal Language, naming, repeating,spontaneous speech      Fund of knowledge   CRANIAL NERVES: CN II: Visual fields are full to confrontation. Fundoscopic exam is normal with sharp discs and no vascular changes. Pupils are round equal and briskly reactive to light. CN III, IV, VI: extraocular movement are normal. No ptosis. CN V: Facial sensation is intact to pinprick in all 3 divisions bilaterally. Corneal responses are intact.  CN VII: Face is symmetric with normal eye closure and smile. CN VIII: Hearing is normal to rubbing fingers CN IX, X: Palate elevates symmetrically. Phonation is normal. CN XI: Head turning and shoulder shrug are intact CN XII: Tongue is midline with normal movements and no atrophy.  MOTOR: There is no pronator drift of out-stretched arms. Muscle bulk and tone are normal. Muscle strength is normal.  REFLEXES: Reflexes are 2+ and symmetric at the biceps, triceps, knees, and ankles. Plantar responses are flexor.  SENSORY: Intact to light touch, pinprick, positional sensation and vibratory sensation are intact in fingers and toes.  COORDINATION: Rapid alternating movements and fine finger movements are intact. There is no dysmetria on finger-to-nose and heel-knee-shin.    GAIT/STANCE: She needs pushed up to get up from seated position, unsteady, cautious, rely on her walker Romberg is absent.   DIAGNOSTIC DATA (LABS, IMAGING, TESTING) - I reviewed patient records, labs, notes, testing and imaging myself where available.   ASSESSMENT AND PLAN  DESTANI WAMSER is a 84 y.o. female   Orthostatic hypotension,  Most likely due to central nervous system degenerative disorder,  I do not see significant parkinsonian features, in addition, she reported no improvement taking Sinemet and Azilect,  Will hold off Sinemet, Azilect for right now  Increase water intake  Add on Mestinon 60 mg 3 times a day  Laboratory evaluation to rule out treatable etiology  Marcial Pacas, M.D. Ph.D.  [facility] [address], [place], [place]  [address] Ph: [phone] Fax: [phone]  CC: Glenda Chroman, MD

## 2017-06-18 NOTE — Patient Instructions (Addendum)
Stop Sinemet (carbidopa/levodopa), Stop Azilect  Start Mestinon=Pyridostigomine 60mg  three times a day at 8, 12, 16:00pm

## 2017-06-19 DIAGNOSIS — R55 Syncope and collapse: Secondary | ICD-10-CM | POA: Diagnosis not present

## 2017-06-19 DIAGNOSIS — G20 Parkinson's disease: Secondary | ICD-10-CM | POA: Diagnosis not present

## 2017-06-19 DIAGNOSIS — I10 Essential (primary) hypertension: Secondary | ICD-10-CM | POA: Diagnosis not present

## 2017-06-20 ENCOUNTER — Telehealth: Payer: Self-pay | Admitting: Neurology

## 2017-06-20 LAB — TSH: TSH: 1.43 u[IU]/mL (ref 0.450–4.500)

## 2017-06-20 LAB — VITAMIN B12: Vitamin B-12: 310 pg/mL (ref 232–1245)

## 2017-06-20 LAB — IMMUNOFIXATION ELECTROPHORESIS
IGA/IMMUNOGLOBULIN A, SERUM: 240 mg/dL (ref 64–422)
IGG (IMMUNOGLOBIN G), SERUM: 654 mg/dL — AB (ref 700–1600)
IgM (Immunoglobulin M), Srm: 76 mg/dL (ref 26–217)
Total Protein: 6.5 g/dL (ref 6.0–8.5)

## 2017-06-20 LAB — C-REACTIVE PROTEIN: CRP: 12.8 mg/L — AB (ref 0.0–4.9)

## 2017-06-20 LAB — HGB A1C W/O EAG: HEMOGLOBIN A1C: 5.4 % (ref 4.8–5.6)

## 2017-06-20 LAB — IRON AND TIBC
Iron Saturation: 32 % (ref 15–55)
Iron: 118 ug/dL (ref 27–139)
TIBC: 374 ug/dL (ref 250–450)
UIBC: 256 ug/dL (ref 118–369)

## 2017-06-20 LAB — ANA W/REFLEX IF POSITIVE: Anti Nuclear Antibody(ANA): NEGATIVE

## 2017-06-20 LAB — FERRITIN: Ferritin: 28 ng/mL (ref 15–150)

## 2017-06-20 LAB — CK: Total CK: 78 U/L (ref 24–173)

## 2017-06-20 LAB — VITAMIN D 25 HYDROXY (VIT D DEFICIENCY, FRACTURES): VIT D 25 HYDROXY: 47.7 ng/mL (ref 30.0–100.0)

## 2017-06-20 LAB — COPPER, SERUM: Copper: 164 ug/dL (ref 72–166)

## 2017-06-20 LAB — SEDIMENTATION RATE: Sed Rate: 6 mm/hr (ref 0–40)

## 2017-06-20 LAB — FOLATE: Folate: 12.9 ng/mL (ref >3.0)

## 2017-06-20 LAB — RPR: RPR Ser Ql: NONREACTIVE

## 2017-06-20 NOTE — Telephone Encounter (Signed)
Spoke to patient she is aware of results

## 2017-06-20 NOTE — Telephone Encounter (Signed)
Please call patient, extensive laboratory evaluation showed elevated C-reactive protein, with normal ESR 6, it is unknown clinical significance.  Rest of the laboratory evaluation showed no significant abnormalities.

## 2017-06-27 DIAGNOSIS — R55 Syncope and collapse: Secondary | ICD-10-CM | POA: Diagnosis not present

## 2017-06-27 DIAGNOSIS — F419 Anxiety disorder, unspecified: Secondary | ICD-10-CM | POA: Diagnosis not present

## 2017-06-27 DIAGNOSIS — I10 Essential (primary) hypertension: Secondary | ICD-10-CM | POA: Diagnosis not present

## 2017-06-28 DIAGNOSIS — H6123 Impacted cerumen, bilateral: Secondary | ICD-10-CM | POA: Diagnosis not present

## 2017-07-03 DIAGNOSIS — R55 Syncope and collapse: Secondary | ICD-10-CM | POA: Diagnosis not present

## 2017-07-03 DIAGNOSIS — I10 Essential (primary) hypertension: Secondary | ICD-10-CM | POA: Diagnosis not present

## 2017-07-03 DIAGNOSIS — I481 Persistent atrial fibrillation: Secondary | ICD-10-CM | POA: Diagnosis not present

## 2017-07-03 DIAGNOSIS — G20 Parkinson's disease: Secondary | ICD-10-CM | POA: Diagnosis not present

## 2017-07-08 DIAGNOSIS — I10 Essential (primary) hypertension: Secondary | ICD-10-CM | POA: Diagnosis not present

## 2017-07-08 DIAGNOSIS — G20 Parkinson's disease: Secondary | ICD-10-CM | POA: Diagnosis not present

## 2017-07-08 DIAGNOSIS — I509 Heart failure, unspecified: Secondary | ICD-10-CM | POA: Diagnosis not present

## 2017-07-08 DIAGNOSIS — Z452 Encounter for adjustment and management of vascular access device: Secondary | ICD-10-CM | POA: Diagnosis not present

## 2017-07-08 DIAGNOSIS — I959 Hypotension, unspecified: Secondary | ICD-10-CM | POA: Diagnosis not present

## 2017-07-08 DIAGNOSIS — R51 Headache: Secondary | ICD-10-CM | POA: Diagnosis not present

## 2017-07-08 DIAGNOSIS — I4891 Unspecified atrial fibrillation: Secondary | ICD-10-CM | POA: Diagnosis not present

## 2017-07-08 DIAGNOSIS — J449 Chronic obstructive pulmonary disease, unspecified: Secondary | ICD-10-CM | POA: Diagnosis not present

## 2017-07-08 DIAGNOSIS — I110 Hypertensive heart disease with heart failure: Secondary | ICD-10-CM | POA: Diagnosis not present

## 2017-07-08 DIAGNOSIS — Z79899 Other long term (current) drug therapy: Secondary | ICD-10-CM | POA: Diagnosis not present

## 2017-07-08 DIAGNOSIS — R42 Dizziness and giddiness: Secondary | ICD-10-CM | POA: Diagnosis not present

## 2017-07-08 DIAGNOSIS — I951 Orthostatic hypotension: Secondary | ICD-10-CM | POA: Diagnosis not present

## 2017-07-10 DIAGNOSIS — G20 Parkinson's disease: Secondary | ICD-10-CM | POA: Diagnosis not present

## 2017-07-10 DIAGNOSIS — I951 Orthostatic hypotension: Secondary | ICD-10-CM | POA: Diagnosis not present

## 2017-07-12 DIAGNOSIS — Y998 Other external cause status: Secondary | ICD-10-CM | POA: Diagnosis not present

## 2017-07-12 DIAGNOSIS — J449 Chronic obstructive pulmonary disease, unspecified: Secondary | ICD-10-CM | POA: Diagnosis not present

## 2017-07-12 DIAGNOSIS — I5030 Unspecified diastolic (congestive) heart failure: Secondary | ICD-10-CM | POA: Diagnosis not present

## 2017-07-12 DIAGNOSIS — M542 Cervicalgia: Secondary | ICD-10-CM | POA: Diagnosis not present

## 2017-07-12 DIAGNOSIS — Z8673 Personal history of transient ischemic attack (TIA), and cerebral infarction without residual deficits: Secondary | ICD-10-CM | POA: Diagnosis not present

## 2017-07-12 DIAGNOSIS — R55 Syncope and collapse: Secondary | ICD-10-CM | POA: Diagnosis not present

## 2017-07-12 DIAGNOSIS — M549 Dorsalgia, unspecified: Secondary | ICD-10-CM | POA: Diagnosis not present

## 2017-07-12 DIAGNOSIS — G8929 Other chronic pain: Secondary | ICD-10-CM | POA: Diagnosis not present

## 2017-07-12 DIAGNOSIS — I2510 Atherosclerotic heart disease of native coronary artery without angina pectoris: Secondary | ICD-10-CM | POA: Diagnosis not present

## 2017-07-12 DIAGNOSIS — G20 Parkinson's disease: Secondary | ICD-10-CM | POA: Diagnosis not present

## 2017-07-12 DIAGNOSIS — Z950 Presence of cardiac pacemaker: Secondary | ICD-10-CM | POA: Diagnosis not present

## 2017-07-12 DIAGNOSIS — W01198A Fall on same level from slipping, tripping and stumbling with subsequent striking against other object, initial encounter: Secondary | ICD-10-CM | POA: Diagnosis not present

## 2017-07-12 DIAGNOSIS — I951 Orthostatic hypotension: Secondary | ICD-10-CM | POA: Diagnosis not present

## 2017-07-12 DIAGNOSIS — E785 Hyperlipidemia, unspecified: Secondary | ICD-10-CM | POA: Diagnosis not present

## 2017-07-12 DIAGNOSIS — G909 Disorder of the autonomic nervous system, unspecified: Secondary | ICD-10-CM | POA: Diagnosis not present

## 2017-07-12 DIAGNOSIS — R296 Repeated falls: Secondary | ICD-10-CM | POA: Diagnosis not present

## 2017-07-12 DIAGNOSIS — I110 Hypertensive heart disease with heart failure: Secondary | ICD-10-CM | POA: Diagnosis not present

## 2017-07-12 DIAGNOSIS — Z7901 Long term (current) use of anticoagulants: Secondary | ICD-10-CM | POA: Diagnosis not present

## 2017-07-12 DIAGNOSIS — I10 Essential (primary) hypertension: Secondary | ICD-10-CM | POA: Diagnosis not present

## 2017-07-12 DIAGNOSIS — F418 Other specified anxiety disorders: Secondary | ICD-10-CM | POA: Diagnosis not present

## 2017-07-13 DIAGNOSIS — R55 Syncope and collapse: Secondary | ICD-10-CM | POA: Diagnosis not present

## 2017-07-13 DIAGNOSIS — G20 Parkinson's disease: Secondary | ICD-10-CM | POA: Diagnosis not present

## 2017-07-13 DIAGNOSIS — F329 Major depressive disorder, single episode, unspecified: Secondary | ICD-10-CM | POA: Diagnosis not present

## 2017-07-13 DIAGNOSIS — W01198A Fall on same level from slipping, tripping and stumbling with subsequent striking against other object, initial encounter: Secondary | ICD-10-CM | POA: Diagnosis not present

## 2017-07-13 DIAGNOSIS — I2510 Atherosclerotic heart disease of native coronary artery without angina pectoris: Secondary | ICD-10-CM | POA: Diagnosis not present

## 2017-07-13 DIAGNOSIS — Z8679 Personal history of other diseases of the circulatory system: Secondary | ICD-10-CM | POA: Diagnosis not present

## 2017-07-13 DIAGNOSIS — J449 Chronic obstructive pulmonary disease, unspecified: Secondary | ICD-10-CM | POA: Diagnosis not present

## 2017-07-13 DIAGNOSIS — M542 Cervicalgia: Secondary | ICD-10-CM | POA: Diagnosis not present

## 2017-07-13 DIAGNOSIS — M545 Low back pain: Secondary | ICD-10-CM | POA: Diagnosis not present

## 2017-07-13 DIAGNOSIS — Z7901 Long term (current) use of anticoagulants: Secondary | ICD-10-CM | POA: Diagnosis not present

## 2017-07-13 DIAGNOSIS — Y998 Other external cause status: Secondary | ICD-10-CM | POA: Diagnosis not present

## 2017-07-13 DIAGNOSIS — Z950 Presence of cardiac pacemaker: Secondary | ICD-10-CM | POA: Diagnosis not present

## 2017-07-13 DIAGNOSIS — Z79899 Other long term (current) drug therapy: Secondary | ICD-10-CM | POA: Diagnosis not present

## 2017-07-13 DIAGNOSIS — R296 Repeated falls: Secondary | ICD-10-CM | POA: Diagnosis not present

## 2017-07-13 DIAGNOSIS — I10 Essential (primary) hypertension: Secondary | ICD-10-CM | POA: Diagnosis not present

## 2017-07-14 DIAGNOSIS — R55 Syncope and collapse: Secondary | ICD-10-CM | POA: Diagnosis not present

## 2017-07-14 DIAGNOSIS — M545 Low back pain: Secondary | ICD-10-CM | POA: Diagnosis not present

## 2017-07-14 DIAGNOSIS — G20 Parkinson's disease: Secondary | ICD-10-CM | POA: Diagnosis not present

## 2017-07-14 DIAGNOSIS — Z79899 Other long term (current) drug therapy: Secondary | ICD-10-CM | POA: Diagnosis not present

## 2017-07-14 DIAGNOSIS — Z8679 Personal history of other diseases of the circulatory system: Secondary | ICD-10-CM | POA: Diagnosis not present

## 2017-07-14 DIAGNOSIS — R296 Repeated falls: Secondary | ICD-10-CM | POA: Diagnosis not present

## 2017-07-14 DIAGNOSIS — F329 Major depressive disorder, single episode, unspecified: Secondary | ICD-10-CM | POA: Diagnosis not present

## 2017-07-14 DIAGNOSIS — Z950 Presence of cardiac pacemaker: Secondary | ICD-10-CM | POA: Diagnosis not present

## 2017-07-14 DIAGNOSIS — Z7901 Long term (current) use of anticoagulants: Secondary | ICD-10-CM | POA: Diagnosis not present

## 2017-07-14 DIAGNOSIS — I10 Essential (primary) hypertension: Secondary | ICD-10-CM | POA: Diagnosis not present

## 2017-07-14 DIAGNOSIS — I2510 Atherosclerotic heart disease of native coronary artery without angina pectoris: Secondary | ICD-10-CM | POA: Diagnosis not present

## 2017-07-14 DIAGNOSIS — J449 Chronic obstructive pulmonary disease, unspecified: Secondary | ICD-10-CM | POA: Diagnosis not present

## 2017-07-15 DIAGNOSIS — E860 Dehydration: Secondary | ICD-10-CM | POA: Diagnosis present

## 2017-07-15 DIAGNOSIS — M542 Cervicalgia: Secondary | ICD-10-CM | POA: Diagnosis not present

## 2017-07-15 DIAGNOSIS — Z85828 Personal history of other malignant neoplasm of skin: Secondary | ICD-10-CM | POA: Diagnosis not present

## 2017-07-15 DIAGNOSIS — I10 Essential (primary) hypertension: Secondary | ICD-10-CM | POA: Diagnosis not present

## 2017-07-15 DIAGNOSIS — I5030 Unspecified diastolic (congestive) heart failure: Secondary | ICD-10-CM | POA: Diagnosis not present

## 2017-07-15 DIAGNOSIS — J449 Chronic obstructive pulmonary disease, unspecified: Secondary | ICD-10-CM | POA: Diagnosis not present

## 2017-07-15 DIAGNOSIS — R42 Dizziness and giddiness: Secondary | ICD-10-CM | POA: Diagnosis not present

## 2017-07-15 DIAGNOSIS — N390 Urinary tract infection, site not specified: Secondary | ICD-10-CM | POA: Diagnosis not present

## 2017-07-15 DIAGNOSIS — Z743 Need for continuous supervision: Secondary | ICD-10-CM | POA: Diagnosis not present

## 2017-07-15 DIAGNOSIS — I110 Hypertensive heart disease with heart failure: Secondary | ICD-10-CM | POA: Diagnosis not present

## 2017-07-15 DIAGNOSIS — G20 Parkinson's disease: Secondary | ICD-10-CM | POA: Diagnosis not present

## 2017-07-15 DIAGNOSIS — M545 Low back pain: Secondary | ICD-10-CM | POA: Diagnosis not present

## 2017-07-15 DIAGNOSIS — I6389 Other cerebral infarction: Secondary | ICD-10-CM | POA: Diagnosis not present

## 2017-07-15 DIAGNOSIS — Z8673 Personal history of transient ischemic attack (TIA), and cerebral infarction without residual deficits: Secondary | ICD-10-CM | POA: Diagnosis not present

## 2017-07-15 DIAGNOSIS — Z95810 Presence of automatic (implantable) cardiac defibrillator: Secondary | ICD-10-CM | POA: Diagnosis not present

## 2017-07-15 DIAGNOSIS — I951 Orthostatic hypotension: Secondary | ICD-10-CM | POA: Diagnosis present

## 2017-07-15 DIAGNOSIS — G44209 Tension-type headache, unspecified, not intractable: Secondary | ICD-10-CM | POA: Diagnosis not present

## 2017-07-15 DIAGNOSIS — I639 Cerebral infarction, unspecified: Secondary | ICD-10-CM | POA: Diagnosis not present

## 2017-07-15 DIAGNOSIS — Z8249 Family history of ischemic heart disease and other diseases of the circulatory system: Secondary | ICD-10-CM | POA: Diagnosis not present

## 2017-07-15 DIAGNOSIS — Z96641 Presence of right artificial hip joint: Secondary | ICD-10-CM | POA: Diagnosis present

## 2017-07-15 DIAGNOSIS — Z950 Presence of cardiac pacemaker: Secondary | ICD-10-CM | POA: Diagnosis not present

## 2017-07-15 DIAGNOSIS — R296 Repeated falls: Secondary | ICD-10-CM | POA: Diagnosis present

## 2017-07-15 DIAGNOSIS — M6281 Muscle weakness (generalized): Secondary | ICD-10-CM | POA: Diagnosis not present

## 2017-07-15 DIAGNOSIS — Z90710 Acquired absence of both cervix and uterus: Secondary | ICD-10-CM | POA: Diagnosis not present

## 2017-07-15 DIAGNOSIS — R279 Unspecified lack of coordination: Secondary | ICD-10-CM | POA: Diagnosis not present

## 2017-07-15 DIAGNOSIS — I4891 Unspecified atrial fibrillation: Secondary | ICD-10-CM | POA: Diagnosis not present

## 2017-07-15 DIAGNOSIS — E785 Hyperlipidemia, unspecified: Secondary | ICD-10-CM | POA: Diagnosis present

## 2017-07-15 DIAGNOSIS — H8110 Benign paroxysmal vertigo, unspecified ear: Secondary | ICD-10-CM | POA: Diagnosis not present

## 2017-07-15 DIAGNOSIS — R41841 Cognitive communication deficit: Secondary | ICD-10-CM | POA: Diagnosis not present

## 2017-07-15 DIAGNOSIS — Z7901 Long term (current) use of anticoagulants: Secondary | ICD-10-CM | POA: Diagnosis not present

## 2017-07-15 DIAGNOSIS — F329 Major depressive disorder, single episode, unspecified: Secondary | ICD-10-CM | POA: Diagnosis not present

## 2017-07-15 DIAGNOSIS — R55 Syncope and collapse: Secondary | ICD-10-CM | POA: Diagnosis not present

## 2017-07-15 DIAGNOSIS — G909 Disorder of the autonomic nervous system, unspecified: Secondary | ICD-10-CM | POA: Diagnosis present

## 2017-07-15 DIAGNOSIS — R278 Other lack of coordination: Secondary | ICD-10-CM | POA: Diagnosis not present

## 2017-07-15 DIAGNOSIS — I2510 Atherosclerotic heart disease of native coronary artery without angina pectoris: Secondary | ICD-10-CM | POA: Diagnosis not present

## 2017-07-15 DIAGNOSIS — Z79899 Other long term (current) drug therapy: Secondary | ICD-10-CM | POA: Diagnosis not present

## 2017-07-15 DIAGNOSIS — F320 Major depressive disorder, single episode, mild: Secondary | ICD-10-CM | POA: Diagnosis not present

## 2017-07-18 DIAGNOSIS — G20 Parkinson's disease: Secondary | ICD-10-CM | POA: Diagnosis not present

## 2017-07-18 DIAGNOSIS — F418 Other specified anxiety disorders: Secondary | ICD-10-CM | POA: Diagnosis not present

## 2017-07-18 DIAGNOSIS — Z743 Need for continuous supervision: Secondary | ICD-10-CM | POA: Diagnosis not present

## 2017-07-18 DIAGNOSIS — H9193 Unspecified hearing loss, bilateral: Secondary | ICD-10-CM | POA: Diagnosis not present

## 2017-07-18 DIAGNOSIS — F4322 Adjustment disorder with anxiety: Secondary | ICD-10-CM | POA: Diagnosis not present

## 2017-07-18 DIAGNOSIS — F331 Major depressive disorder, recurrent, moderate: Secondary | ICD-10-CM | POA: Diagnosis not present

## 2017-07-18 DIAGNOSIS — I639 Cerebral infarction, unspecified: Secondary | ICD-10-CM | POA: Diagnosis not present

## 2017-07-18 DIAGNOSIS — Z950 Presence of cardiac pacemaker: Secondary | ICD-10-CM | POA: Diagnosis not present

## 2017-07-18 DIAGNOSIS — M545 Low back pain: Secondary | ICD-10-CM | POA: Diagnosis not present

## 2017-07-18 DIAGNOSIS — F411 Generalized anxiety disorder: Secondary | ICD-10-CM | POA: Diagnosis not present

## 2017-07-18 DIAGNOSIS — I110 Hypertensive heart disease with heart failure: Secondary | ICD-10-CM | POA: Diagnosis not present

## 2017-07-18 DIAGNOSIS — Z7901 Long term (current) use of anticoagulants: Secondary | ICD-10-CM | POA: Diagnosis not present

## 2017-07-18 DIAGNOSIS — I10 Essential (primary) hypertension: Secondary | ICD-10-CM | POA: Diagnosis not present

## 2017-07-18 DIAGNOSIS — R55 Syncope and collapse: Secondary | ICD-10-CM | POA: Diagnosis not present

## 2017-07-18 DIAGNOSIS — M6281 Muscle weakness (generalized): Secondary | ICD-10-CM | POA: Diagnosis not present

## 2017-07-18 DIAGNOSIS — F4323 Adjustment disorder with mixed anxiety and depressed mood: Secondary | ICD-10-CM | POA: Diagnosis not present

## 2017-07-18 DIAGNOSIS — I480 Paroxysmal atrial fibrillation: Secondary | ICD-10-CM | POA: Diagnosis not present

## 2017-07-18 DIAGNOSIS — I2510 Atherosclerotic heart disease of native coronary artery without angina pectoris: Secondary | ICD-10-CM | POA: Diagnosis not present

## 2017-07-18 DIAGNOSIS — I6389 Other cerebral infarction: Secondary | ICD-10-CM | POA: Diagnosis not present

## 2017-07-18 DIAGNOSIS — F320 Major depressive disorder, single episode, mild: Secondary | ICD-10-CM | POA: Diagnosis not present

## 2017-07-18 DIAGNOSIS — I4891 Unspecified atrial fibrillation: Secondary | ICD-10-CM | POA: Diagnosis not present

## 2017-07-18 DIAGNOSIS — G4700 Insomnia, unspecified: Secondary | ICD-10-CM | POA: Diagnosis not present

## 2017-07-18 DIAGNOSIS — R41841 Cognitive communication deficit: Secondary | ICD-10-CM | POA: Diagnosis not present

## 2017-07-18 DIAGNOSIS — R42 Dizziness and giddiness: Secondary | ICD-10-CM | POA: Diagnosis not present

## 2017-07-18 DIAGNOSIS — J449 Chronic obstructive pulmonary disease, unspecified: Secondary | ICD-10-CM | POA: Diagnosis not present

## 2017-07-18 DIAGNOSIS — R278 Other lack of coordination: Secondary | ICD-10-CM | POA: Diagnosis not present

## 2017-07-18 DIAGNOSIS — R634 Abnormal weight loss: Secondary | ICD-10-CM | POA: Diagnosis not present

## 2017-07-18 DIAGNOSIS — H8113 Benign paroxysmal vertigo, bilateral: Secondary | ICD-10-CM | POA: Diagnosis not present

## 2017-07-18 DIAGNOSIS — Z79899 Other long term (current) drug therapy: Secondary | ICD-10-CM | POA: Diagnosis not present

## 2017-07-18 DIAGNOSIS — R279 Unspecified lack of coordination: Secondary | ICD-10-CM | POA: Diagnosis not present

## 2017-07-18 DIAGNOSIS — G44209 Tension-type headache, unspecified, not intractable: Secondary | ICD-10-CM | POA: Diagnosis not present

## 2017-07-18 DIAGNOSIS — I5030 Unspecified diastolic (congestive) heart failure: Secondary | ICD-10-CM | POA: Diagnosis not present

## 2017-07-18 DIAGNOSIS — R5381 Other malaise: Secondary | ICD-10-CM | POA: Diagnosis not present

## 2017-07-18 DIAGNOSIS — F329 Major depressive disorder, single episode, unspecified: Secondary | ICD-10-CM | POA: Diagnosis not present

## 2017-07-18 DIAGNOSIS — M542 Cervicalgia: Secondary | ICD-10-CM | POA: Diagnosis not present

## 2017-07-18 DIAGNOSIS — N390 Urinary tract infection, site not specified: Secondary | ICD-10-CM | POA: Diagnosis not present

## 2017-07-19 DIAGNOSIS — R42 Dizziness and giddiness: Secondary | ICD-10-CM | POA: Diagnosis not present

## 2017-07-19 DIAGNOSIS — I10 Essential (primary) hypertension: Secondary | ICD-10-CM | POA: Diagnosis not present

## 2017-07-19 DIAGNOSIS — G20 Parkinson's disease: Secondary | ICD-10-CM | POA: Diagnosis not present

## 2017-07-19 DIAGNOSIS — I6389 Other cerebral infarction: Secondary | ICD-10-CM | POA: Diagnosis not present

## 2017-07-25 DIAGNOSIS — F320 Major depressive disorder, single episode, mild: Secondary | ICD-10-CM | POA: Diagnosis not present

## 2017-08-01 DIAGNOSIS — F329 Major depressive disorder, single episode, unspecified: Secondary | ICD-10-CM | POA: Diagnosis not present

## 2017-08-01 DIAGNOSIS — M542 Cervicalgia: Secondary | ICD-10-CM | POA: Diagnosis not present

## 2017-08-01 DIAGNOSIS — H9193 Unspecified hearing loss, bilateral: Secondary | ICD-10-CM | POA: Diagnosis not present

## 2017-08-01 DIAGNOSIS — R42 Dizziness and giddiness: Secondary | ICD-10-CM | POA: Diagnosis not present

## 2017-08-09 DIAGNOSIS — F320 Major depressive disorder, single episode, mild: Secondary | ICD-10-CM | POA: Diagnosis not present

## 2017-08-09 DIAGNOSIS — G20 Parkinson's disease: Secondary | ICD-10-CM | POA: Diagnosis not present

## 2017-08-09 DIAGNOSIS — R634 Abnormal weight loss: Secondary | ICD-10-CM | POA: Diagnosis not present

## 2017-08-14 DIAGNOSIS — I480 Paroxysmal atrial fibrillation: Secondary | ICD-10-CM | POA: Diagnosis not present

## 2017-08-14 DIAGNOSIS — G20 Parkinson's disease: Secondary | ICD-10-CM | POA: Diagnosis not present

## 2017-08-14 DIAGNOSIS — F320 Major depressive disorder, single episode, mild: Secondary | ICD-10-CM | POA: Diagnosis not present

## 2017-08-15 DIAGNOSIS — H8113 Benign paroxysmal vertigo, bilateral: Secondary | ICD-10-CM | POA: Diagnosis not present

## 2017-08-19 DIAGNOSIS — F418 Other specified anxiety disorders: Secondary | ICD-10-CM | POA: Diagnosis not present

## 2017-08-19 DIAGNOSIS — F320 Major depressive disorder, single episode, mild: Secondary | ICD-10-CM | POA: Diagnosis not present

## 2017-08-19 DIAGNOSIS — I10 Essential (primary) hypertension: Secondary | ICD-10-CM | POA: Diagnosis not present

## 2017-08-19 DIAGNOSIS — R5381 Other malaise: Secondary | ICD-10-CM | POA: Diagnosis not present

## 2017-08-20 DIAGNOSIS — F418 Other specified anxiety disorders: Secondary | ICD-10-CM | POA: Diagnosis not present

## 2017-08-21 DIAGNOSIS — I951 Orthostatic hypotension: Secondary | ICD-10-CM | POA: Diagnosis not present

## 2017-08-21 DIAGNOSIS — G20 Parkinson's disease: Secondary | ICD-10-CM | POA: Diagnosis not present

## 2017-08-22 ENCOUNTER — Ambulatory Visit: Payer: Medicare Other | Admitting: Neurology

## 2017-08-22 DIAGNOSIS — I951 Orthostatic hypotension: Secondary | ICD-10-CM | POA: Diagnosis not present

## 2017-08-22 DIAGNOSIS — G20 Parkinson's disease: Secondary | ICD-10-CM | POA: Diagnosis not present

## 2017-08-23 ENCOUNTER — Encounter: Payer: Self-pay | Admitting: Neurology

## 2017-08-23 DIAGNOSIS — G20 Parkinson's disease: Secondary | ICD-10-CM | POA: Diagnosis not present

## 2017-08-23 DIAGNOSIS — N390 Urinary tract infection, site not specified: Secondary | ICD-10-CM | POA: Diagnosis not present

## 2017-08-23 DIAGNOSIS — I10 Essential (primary) hypertension: Secondary | ICD-10-CM | POA: Diagnosis not present

## 2017-08-23 DIAGNOSIS — F419 Anxiety disorder, unspecified: Secondary | ICD-10-CM | POA: Diagnosis not present

## 2017-08-23 DIAGNOSIS — M810 Age-related osteoporosis without current pathological fracture: Secondary | ICD-10-CM | POA: Diagnosis not present

## 2017-08-23 DIAGNOSIS — I951 Orthostatic hypotension: Secondary | ICD-10-CM | POA: Diagnosis not present

## 2017-08-23 DIAGNOSIS — F329 Major depressive disorder, single episode, unspecified: Secondary | ICD-10-CM | POA: Diagnosis not present

## 2017-08-23 DIAGNOSIS — I509 Heart failure, unspecified: Secondary | ICD-10-CM | POA: Diagnosis not present

## 2017-08-23 DIAGNOSIS — Z0000 Encounter for general adult medical examination without abnormal findings: Secondary | ICD-10-CM | POA: Diagnosis not present

## 2017-08-23 DIAGNOSIS — I4891 Unspecified atrial fibrillation: Secondary | ICD-10-CM | POA: Diagnosis not present

## 2017-09-03 DIAGNOSIS — I951 Orthostatic hypotension: Secondary | ICD-10-CM | POA: Diagnosis not present

## 2017-09-03 DIAGNOSIS — G20 Parkinson's disease: Secondary | ICD-10-CM | POA: Diagnosis not present

## 2017-09-04 DIAGNOSIS — I951 Orthostatic hypotension: Secondary | ICD-10-CM | POA: Diagnosis not present

## 2017-09-04 DIAGNOSIS — G20 Parkinson's disease: Secondary | ICD-10-CM | POA: Diagnosis not present

## 2017-09-05 DIAGNOSIS — G20 Parkinson's disease: Secondary | ICD-10-CM | POA: Diagnosis not present

## 2017-09-05 DIAGNOSIS — I951 Orthostatic hypotension: Secondary | ICD-10-CM | POA: Diagnosis not present

## 2017-09-08 DIAGNOSIS — I10 Essential (primary) hypertension: Secondary | ICD-10-CM | POA: Diagnosis not present

## 2017-09-08 DIAGNOSIS — I509 Heart failure, unspecified: Secondary | ICD-10-CM | POA: Diagnosis not present

## 2017-09-12 DIAGNOSIS — I4891 Unspecified atrial fibrillation: Secondary | ICD-10-CM | POA: Diagnosis not present

## 2017-09-12 DIAGNOSIS — G20 Parkinson's disease: Secondary | ICD-10-CM | POA: Diagnosis not present

## 2017-09-12 DIAGNOSIS — Z636 Dependent relative needing care at home: Secondary | ICD-10-CM | POA: Diagnosis not present

## 2017-09-12 DIAGNOSIS — I10 Essential (primary) hypertension: Secondary | ICD-10-CM | POA: Diagnosis not present

## 2017-09-12 DIAGNOSIS — F4321 Adjustment disorder with depressed mood: Secondary | ICD-10-CM | POA: Diagnosis not present

## 2017-09-12 DIAGNOSIS — I2510 Atherosclerotic heart disease of native coronary artery without angina pectoris: Secondary | ICD-10-CM | POA: Diagnosis not present

## 2017-09-23 ENCOUNTER — Other Ambulatory Visit: Payer: Self-pay | Admitting: Cardiovascular Disease

## 2017-10-02 ENCOUNTER — Telehealth: Payer: Self-pay | Admitting: Cardiovascular Disease

## 2017-10-02 NOTE — Telephone Encounter (Signed)
Patient called to state that she has moved out of town and will not be coming to office any longer

## 2017-10-09 DIAGNOSIS — M2012 Hallux valgus (acquired), left foot: Secondary | ICD-10-CM | POA: Diagnosis not present

## 2017-10-09 DIAGNOSIS — B351 Tinea unguium: Secondary | ICD-10-CM | POA: Diagnosis not present

## 2017-10-09 DIAGNOSIS — M79674 Pain in right toe(s): Secondary | ICD-10-CM | POA: Diagnosis not present

## 2017-10-09 DIAGNOSIS — I739 Peripheral vascular disease, unspecified: Secondary | ICD-10-CM | POA: Diagnosis not present

## 2017-10-25 ENCOUNTER — Encounter: Payer: Self-pay | Admitting: Internal Medicine

## 2017-11-01 ENCOUNTER — Encounter: Payer: Self-pay | Admitting: Internal Medicine

## 2017-12-02 DIAGNOSIS — F339 Major depressive disorder, recurrent, unspecified: Secondary | ICD-10-CM | POA: Diagnosis not present

## 2017-12-02 DIAGNOSIS — Z636 Dependent relative needing care at home: Secondary | ICD-10-CM | POA: Diagnosis not present

## 2017-12-02 DIAGNOSIS — I4891 Unspecified atrial fibrillation: Secondary | ICD-10-CM | POA: Diagnosis not present

## 2017-12-02 DIAGNOSIS — G20 Parkinson's disease: Secondary | ICD-10-CM | POA: Diagnosis not present

## 2017-12-02 DIAGNOSIS — R55 Syncope and collapse: Secondary | ICD-10-CM | POA: Diagnosis not present

## 2017-12-02 DIAGNOSIS — K219 Gastro-esophageal reflux disease without esophagitis: Secondary | ICD-10-CM | POA: Diagnosis not present

## 2017-12-02 DIAGNOSIS — Z950 Presence of cardiac pacemaker: Secondary | ICD-10-CM | POA: Diagnosis not present

## 2017-12-02 DIAGNOSIS — Z95828 Presence of other vascular implants and grafts: Secondary | ICD-10-CM | POA: Diagnosis not present

## 2017-12-05 ENCOUNTER — Encounter: Payer: Self-pay | Admitting: Cardiology

## 2018-01-23 ENCOUNTER — Other Ambulatory Visit: Payer: Self-pay | Admitting: Cardiovascular Disease

## 2018-01-25 DIAGNOSIS — I5030 Unspecified diastolic (congestive) heart failure: Secondary | ICD-10-CM | POA: Diagnosis not present

## 2018-01-25 DIAGNOSIS — I110 Hypertensive heart disease with heart failure: Secondary | ICD-10-CM | POA: Diagnosis not present

## 2018-01-27 DIAGNOSIS — I493 Ventricular premature depolarization: Secondary | ICD-10-CM | POA: Diagnosis not present

## 2018-01-27 DIAGNOSIS — I482 Chronic atrial fibrillation: Secondary | ICD-10-CM | POA: Diagnosis not present

## 2018-01-27 DIAGNOSIS — R0609 Other forms of dyspnea: Secondary | ICD-10-CM | POA: Diagnosis not present

## 2018-01-27 DIAGNOSIS — G20 Parkinson's disease: Secondary | ICD-10-CM | POA: Diagnosis not present

## 2018-01-27 DIAGNOSIS — I2510 Atherosclerotic heart disease of native coronary artery without angina pectoris: Secondary | ICD-10-CM | POA: Diagnosis not present

## 2018-01-27 DIAGNOSIS — I10 Essential (primary) hypertension: Secondary | ICD-10-CM | POA: Diagnosis not present

## 2018-01-27 DIAGNOSIS — Z79899 Other long term (current) drug therapy: Secondary | ICD-10-CM | POA: Diagnosis not present

## 2018-01-27 DIAGNOSIS — I4891 Unspecified atrial fibrillation: Secondary | ICD-10-CM | POA: Diagnosis not present

## 2018-01-27 DIAGNOSIS — I4510 Unspecified right bundle-branch block: Secondary | ICD-10-CM | POA: Diagnosis not present

## 2018-01-27 DIAGNOSIS — Z950 Presence of cardiac pacemaker: Secondary | ICD-10-CM | POA: Diagnosis not present

## 2018-01-27 DIAGNOSIS — I440 Atrioventricular block, first degree: Secondary | ICD-10-CM | POA: Diagnosis not present

## 2018-01-27 DIAGNOSIS — E785 Hyperlipidemia, unspecified: Secondary | ICD-10-CM | POA: Diagnosis not present

## 2018-01-27 DIAGNOSIS — I480 Paroxysmal atrial fibrillation: Secondary | ICD-10-CM | POA: Diagnosis not present

## 2018-01-30 DIAGNOSIS — I5030 Unspecified diastolic (congestive) heart failure: Secondary | ICD-10-CM | POA: Diagnosis not present

## 2018-01-30 DIAGNOSIS — I110 Hypertensive heart disease with heart failure: Secondary | ICD-10-CM | POA: Diagnosis not present

## 2018-02-03 DIAGNOSIS — I110 Hypertensive heart disease with heart failure: Secondary | ICD-10-CM | POA: Diagnosis not present

## 2018-02-03 DIAGNOSIS — Z23 Encounter for immunization: Secondary | ICD-10-CM | POA: Diagnosis not present

## 2018-02-03 DIAGNOSIS — M810 Age-related osteoporosis without current pathological fracture: Secondary | ICD-10-CM | POA: Diagnosis not present

## 2018-02-03 DIAGNOSIS — F339 Major depressive disorder, recurrent, unspecified: Secondary | ICD-10-CM | POA: Diagnosis not present

## 2018-02-03 DIAGNOSIS — I480 Paroxysmal atrial fibrillation: Secondary | ICD-10-CM | POA: Diagnosis not present

## 2018-02-03 DIAGNOSIS — I5030 Unspecified diastolic (congestive) heart failure: Secondary | ICD-10-CM | POA: Diagnosis not present

## 2018-02-03 DIAGNOSIS — G20 Parkinson's disease: Secondary | ICD-10-CM | POA: Diagnosis not present

## 2018-02-05 DIAGNOSIS — I110 Hypertensive heart disease with heart failure: Secondary | ICD-10-CM | POA: Diagnosis not present

## 2018-02-05 DIAGNOSIS — I5030 Unspecified diastolic (congestive) heart failure: Secondary | ICD-10-CM | POA: Diagnosis not present

## 2018-02-07 DIAGNOSIS — I5030 Unspecified diastolic (congestive) heart failure: Secondary | ICD-10-CM | POA: Diagnosis not present

## 2018-02-07 DIAGNOSIS — I110 Hypertensive heart disease with heart failure: Secondary | ICD-10-CM | POA: Diagnosis not present

## 2018-02-11 DIAGNOSIS — I5030 Unspecified diastolic (congestive) heart failure: Secondary | ICD-10-CM | POA: Diagnosis not present

## 2018-02-11 DIAGNOSIS — I110 Hypertensive heart disease with heart failure: Secondary | ICD-10-CM | POA: Diagnosis not present

## 2018-02-12 DIAGNOSIS — I5030 Unspecified diastolic (congestive) heart failure: Secondary | ICD-10-CM | POA: Diagnosis not present

## 2018-02-12 DIAGNOSIS — I110 Hypertensive heart disease with heart failure: Secondary | ICD-10-CM | POA: Diagnosis not present

## 2018-02-13 DIAGNOSIS — I110 Hypertensive heart disease with heart failure: Secondary | ICD-10-CM | POA: Diagnosis not present

## 2018-02-13 DIAGNOSIS — I5030 Unspecified diastolic (congestive) heart failure: Secondary | ICD-10-CM | POA: Diagnosis not present

## 2018-02-17 DIAGNOSIS — I5030 Unspecified diastolic (congestive) heart failure: Secondary | ICD-10-CM | POA: Diagnosis not present

## 2018-02-17 DIAGNOSIS — I110 Hypertensive heart disease with heart failure: Secondary | ICD-10-CM | POA: Diagnosis not present

## 2018-02-18 DIAGNOSIS — I110 Hypertensive heart disease with heart failure: Secondary | ICD-10-CM | POA: Diagnosis not present

## 2018-02-18 DIAGNOSIS — I480 Paroxysmal atrial fibrillation: Secondary | ICD-10-CM | POA: Diagnosis not present

## 2018-02-18 DIAGNOSIS — I10 Essential (primary) hypertension: Secondary | ICD-10-CM | POA: Diagnosis not present

## 2018-02-18 DIAGNOSIS — H9193 Unspecified hearing loss, bilateral: Secondary | ICD-10-CM | POA: Diagnosis not present

## 2018-02-18 DIAGNOSIS — F419 Anxiety disorder, unspecified: Secondary | ICD-10-CM | POA: Diagnosis not present

## 2018-02-18 DIAGNOSIS — I5030 Unspecified diastolic (congestive) heart failure: Secondary | ICD-10-CM | POA: Diagnosis not present

## 2018-02-18 DIAGNOSIS — F4321 Adjustment disorder with depressed mood: Secondary | ICD-10-CM | POA: Diagnosis not present

## 2018-02-18 DIAGNOSIS — G20 Parkinson's disease: Secondary | ICD-10-CM | POA: Diagnosis not present

## 2018-02-18 DIAGNOSIS — F339 Major depressive disorder, recurrent, unspecified: Secondary | ICD-10-CM | POA: Diagnosis not present

## 2018-02-20 DIAGNOSIS — I110 Hypertensive heart disease with heart failure: Secondary | ICD-10-CM | POA: Diagnosis not present

## 2018-02-20 DIAGNOSIS — I5030 Unspecified diastolic (congestive) heart failure: Secondary | ICD-10-CM | POA: Diagnosis not present

## 2018-02-24 DIAGNOSIS — I5030 Unspecified diastolic (congestive) heart failure: Secondary | ICD-10-CM | POA: Diagnosis not present

## 2018-02-24 DIAGNOSIS — I110 Hypertensive heart disease with heart failure: Secondary | ICD-10-CM | POA: Diagnosis not present

## 2018-03-03 DIAGNOSIS — I082 Rheumatic disorders of both aortic and tricuspid valves: Secondary | ICD-10-CM | POA: Diagnosis not present

## 2018-03-03 DIAGNOSIS — I4891 Unspecified atrial fibrillation: Secondary | ICD-10-CM | POA: Diagnosis not present

## 2018-03-04 DIAGNOSIS — I110 Hypertensive heart disease with heart failure: Secondary | ICD-10-CM | POA: Diagnosis not present

## 2018-03-04 DIAGNOSIS — I5030 Unspecified diastolic (congestive) heart failure: Secondary | ICD-10-CM | POA: Diagnosis not present

## 2018-03-05 DIAGNOSIS — I110 Hypertensive heart disease with heart failure: Secondary | ICD-10-CM | POA: Diagnosis not present

## 2018-03-05 DIAGNOSIS — I5030 Unspecified diastolic (congestive) heart failure: Secondary | ICD-10-CM | POA: Diagnosis not present

## 2018-03-12 DIAGNOSIS — I110 Hypertensive heart disease with heart failure: Secondary | ICD-10-CM | POA: Diagnosis not present

## 2018-03-12 DIAGNOSIS — I5030 Unspecified diastolic (congestive) heart failure: Secondary | ICD-10-CM | POA: Diagnosis not present

## 2018-03-13 DIAGNOSIS — I110 Hypertensive heart disease with heart failure: Secondary | ICD-10-CM | POA: Diagnosis not present

## 2018-03-13 DIAGNOSIS — I5030 Unspecified diastolic (congestive) heart failure: Secondary | ICD-10-CM | POA: Diagnosis not present

## 2018-03-17 DIAGNOSIS — I5030 Unspecified diastolic (congestive) heart failure: Secondary | ICD-10-CM | POA: Diagnosis not present

## 2018-03-17 DIAGNOSIS — I110 Hypertensive heart disease with heart failure: Secondary | ICD-10-CM | POA: Diagnosis not present

## 2018-03-24 DIAGNOSIS — I110 Hypertensive heart disease with heart failure: Secondary | ICD-10-CM | POA: Diagnosis not present

## 2018-03-24 DIAGNOSIS — I5030 Unspecified diastolic (congestive) heart failure: Secondary | ICD-10-CM | POA: Diagnosis not present

## 2018-03-24 DIAGNOSIS — F4320 Adjustment disorder, unspecified: Secondary | ICD-10-CM | POA: Diagnosis not present

## 2018-03-27 DIAGNOSIS — R232 Flushing: Secondary | ICD-10-CM | POA: Diagnosis not present

## 2018-03-27 DIAGNOSIS — M810 Age-related osteoporosis without current pathological fracture: Secondary | ICD-10-CM | POA: Diagnosis not present

## 2018-03-27 DIAGNOSIS — F419 Anxiety disorder, unspecified: Secondary | ICD-10-CM | POA: Diagnosis not present

## 2018-03-27 DIAGNOSIS — R8290 Unspecified abnormal findings in urine: Secondary | ICD-10-CM | POA: Diagnosis not present

## 2018-03-27 DIAGNOSIS — F339 Major depressive disorder, recurrent, unspecified: Secondary | ICD-10-CM | POA: Diagnosis not present

## 2018-04-09 DIAGNOSIS — M8588 Other specified disorders of bone density and structure, other site: Secondary | ICD-10-CM | POA: Diagnosis not present

## 2018-04-14 DIAGNOSIS — Z7901 Long term (current) use of anticoagulants: Secondary | ICD-10-CM | POA: Diagnosis not present

## 2018-04-14 DIAGNOSIS — R278 Other lack of coordination: Secondary | ICD-10-CM | POA: Diagnosis not present

## 2018-04-14 DIAGNOSIS — I480 Paroxysmal atrial fibrillation: Secondary | ICD-10-CM | POA: Diagnosis not present

## 2018-04-14 DIAGNOSIS — Z91040 Latex allergy status: Secondary | ICD-10-CM | POA: Diagnosis not present

## 2018-04-14 DIAGNOSIS — R270 Ataxia, unspecified: Secondary | ICD-10-CM | POA: Diagnosis not present

## 2018-04-14 DIAGNOSIS — M48061 Spinal stenosis, lumbar region without neurogenic claudication: Secondary | ICD-10-CM | POA: Diagnosis not present

## 2018-04-14 DIAGNOSIS — F329 Major depressive disorder, single episode, unspecified: Secondary | ICD-10-CM | POA: Diagnosis not present

## 2018-04-14 DIAGNOSIS — F419 Anxiety disorder, unspecified: Secondary | ICD-10-CM | POA: Diagnosis not present

## 2018-04-14 DIAGNOSIS — Z885 Allergy status to narcotic agent status: Secondary | ICD-10-CM | POA: Diagnosis not present

## 2018-04-14 DIAGNOSIS — G20 Parkinson's disease: Secondary | ICD-10-CM | POA: Diagnosis not present

## 2018-04-14 DIAGNOSIS — G629 Polyneuropathy, unspecified: Secondary | ICD-10-CM | POA: Diagnosis not present

## 2018-04-14 DIAGNOSIS — Z950 Presence of cardiac pacemaker: Secondary | ICD-10-CM | POA: Diagnosis not present

## 2018-04-14 DIAGNOSIS — M1990 Unspecified osteoarthritis, unspecified site: Secondary | ICD-10-CM | POA: Diagnosis not present

## 2018-04-22 DIAGNOSIS — I4510 Unspecified right bundle-branch block: Secondary | ICD-10-CM | POA: Diagnosis not present

## 2018-04-22 DIAGNOSIS — Z885 Allergy status to narcotic agent status: Secondary | ICD-10-CM | POA: Diagnosis not present

## 2018-04-22 DIAGNOSIS — Z91040 Latex allergy status: Secondary | ICD-10-CM | POA: Diagnosis not present

## 2018-04-22 DIAGNOSIS — I4891 Unspecified atrial fibrillation: Secondary | ICD-10-CM | POA: Diagnosis not present

## 2018-04-22 DIAGNOSIS — Z9181 History of falling: Secondary | ICD-10-CM | POA: Diagnosis not present

## 2018-04-22 DIAGNOSIS — I440 Atrioventricular block, first degree: Secondary | ICD-10-CM | POA: Diagnosis not present

## 2018-04-28 DIAGNOSIS — R001 Bradycardia, unspecified: Secondary | ICD-10-CM | POA: Diagnosis not present

## 2018-04-28 DIAGNOSIS — Z950 Presence of cardiac pacemaker: Secondary | ICD-10-CM | POA: Diagnosis not present

## 2018-04-28 DIAGNOSIS — Z9181 History of falling: Secondary | ICD-10-CM | POA: Diagnosis not present

## 2018-04-28 DIAGNOSIS — I2510 Atherosclerotic heart disease of native coronary artery without angina pectoris: Secondary | ICD-10-CM | POA: Diagnosis not present

## 2018-04-28 DIAGNOSIS — I480 Paroxysmal atrial fibrillation: Secondary | ICD-10-CM | POA: Diagnosis not present

## 2018-04-28 DIAGNOSIS — Z95828 Presence of other vascular implants and grafts: Secondary | ICD-10-CM | POA: Diagnosis not present

## 2018-04-28 DIAGNOSIS — Z45018 Encounter for adjustment and management of other part of cardiac pacemaker: Secondary | ICD-10-CM | POA: Diagnosis not present

## 2018-04-28 DIAGNOSIS — I499 Cardiac arrhythmia, unspecified: Secondary | ICD-10-CM | POA: Diagnosis not present

## 2018-04-28 DIAGNOSIS — Z95810 Presence of automatic (implantable) cardiac defibrillator: Secondary | ICD-10-CM | POA: Diagnosis not present

## 2018-05-05 DIAGNOSIS — F331 Major depressive disorder, recurrent, moderate: Secondary | ICD-10-CM | POA: Diagnosis not present

## 2018-05-05 DIAGNOSIS — F4321 Adjustment disorder with depressed mood: Secondary | ICD-10-CM | POA: Diagnosis not present

## 2018-05-28 DIAGNOSIS — K449 Diaphragmatic hernia without obstruction or gangrene: Secondary | ICD-10-CM | POA: Diagnosis not present

## 2018-05-28 DIAGNOSIS — Z9181 History of falling: Secondary | ICD-10-CM | POA: Diagnosis not present

## 2018-05-28 DIAGNOSIS — K219 Gastro-esophageal reflux disease without esophagitis: Secondary | ICD-10-CM | POA: Diagnosis not present

## 2018-05-28 DIAGNOSIS — M810 Age-related osteoporosis without current pathological fracture: Secondary | ICD-10-CM | POA: Diagnosis not present

## 2018-05-28 DIAGNOSIS — R29890 Loss of height: Secondary | ICD-10-CM | POA: Diagnosis not present

## 2018-05-28 DIAGNOSIS — I4891 Unspecified atrial fibrillation: Secondary | ICD-10-CM | POA: Diagnosis not present

## 2018-06-13 DIAGNOSIS — R030 Elevated blood-pressure reading, without diagnosis of hypertension: Secondary | ICD-10-CM | POA: Diagnosis not present

## 2018-06-13 DIAGNOSIS — Z8679 Personal history of other diseases of the circulatory system: Secondary | ICD-10-CM | POA: Diagnosis not present

## 2018-06-13 DIAGNOSIS — I10 Essential (primary) hypertension: Secondary | ICD-10-CM | POA: Diagnosis not present

## 2018-06-13 DIAGNOSIS — R51 Headache: Secondary | ICD-10-CM | POA: Diagnosis not present

## 2018-06-13 DIAGNOSIS — I440 Atrioventricular block, first degree: Secondary | ICD-10-CM | POA: Diagnosis not present

## 2018-06-13 DIAGNOSIS — R0902 Hypoxemia: Secondary | ICD-10-CM | POA: Diagnosis not present

## 2018-06-13 DIAGNOSIS — Z7901 Long term (current) use of anticoagulants: Secondary | ICD-10-CM | POA: Diagnosis not present

## 2018-06-13 DIAGNOSIS — R52 Pain, unspecified: Secondary | ICD-10-CM | POA: Diagnosis not present

## 2018-06-13 DIAGNOSIS — I4510 Unspecified right bundle-branch block: Secondary | ICD-10-CM | POA: Diagnosis not present

## 2018-06-15 DIAGNOSIS — R9431 Abnormal electrocardiogram [ECG] [EKG]: Secondary | ICD-10-CM | POA: Diagnosis not present

## 2018-06-15 DIAGNOSIS — I440 Atrioventricular block, first degree: Secondary | ICD-10-CM | POA: Diagnosis not present

## 2018-06-15 DIAGNOSIS — I4510 Unspecified right bundle-branch block: Secondary | ICD-10-CM | POA: Diagnosis not present

## 2018-06-17 DIAGNOSIS — Z79899 Other long term (current) drug therapy: Secondary | ICD-10-CM | POA: Diagnosis not present

## 2018-06-17 DIAGNOSIS — G20 Parkinson's disease: Secondary | ICD-10-CM | POA: Diagnosis not present

## 2018-06-17 DIAGNOSIS — I10 Essential (primary) hypertension: Secondary | ICD-10-CM | POA: Diagnosis not present

## 2018-06-17 DIAGNOSIS — G629 Polyneuropathy, unspecified: Secondary | ICD-10-CM | POA: Diagnosis not present

## 2018-06-17 DIAGNOSIS — R278 Other lack of coordination: Secondary | ICD-10-CM | POA: Diagnosis not present

## 2018-06-23 DIAGNOSIS — D890 Polyclonal hypergammaglobulinemia: Secondary | ICD-10-CM | POA: Diagnosis not present

## 2018-06-23 DIAGNOSIS — G629 Polyneuropathy, unspecified: Secondary | ICD-10-CM | POA: Diagnosis not present

## 2018-06-23 DIAGNOSIS — D472 Monoclonal gammopathy: Secondary | ICD-10-CM | POA: Diagnosis not present

## 2018-06-23 DIAGNOSIS — F331 Major depressive disorder, recurrent, moderate: Secondary | ICD-10-CM | POA: Diagnosis not present

## 2018-06-23 DIAGNOSIS — K219 Gastro-esophageal reflux disease without esophagitis: Secondary | ICD-10-CM | POA: Diagnosis not present

## 2018-06-23 DIAGNOSIS — F419 Anxiety disorder, unspecified: Secondary | ICD-10-CM | POA: Diagnosis not present

## 2018-06-23 DIAGNOSIS — R278 Other lack of coordination: Secondary | ICD-10-CM | POA: Diagnosis not present

## 2018-06-23 DIAGNOSIS — I10 Essential (primary) hypertension: Secondary | ICD-10-CM | POA: Diagnosis not present

## 2018-06-23 DIAGNOSIS — I480 Paroxysmal atrial fibrillation: Secondary | ICD-10-CM | POA: Diagnosis not present

## 2018-06-23 DIAGNOSIS — R232 Flushing: Secondary | ICD-10-CM | POA: Diagnosis not present

## 2018-06-23 DIAGNOSIS — F4321 Adjustment disorder with depressed mood: Secondary | ICD-10-CM | POA: Diagnosis not present

## 2018-06-23 DIAGNOSIS — H938X3 Other specified disorders of ear, bilateral: Secondary | ICD-10-CM | POA: Diagnosis not present

## 2018-06-24 DIAGNOSIS — G629 Polyneuropathy, unspecified: Secondary | ICD-10-CM | POA: Diagnosis not present

## 2018-06-24 DIAGNOSIS — D472 Monoclonal gammopathy: Secondary | ICD-10-CM | POA: Diagnosis not present

## 2018-06-24 DIAGNOSIS — R278 Other lack of coordination: Secondary | ICD-10-CM | POA: Diagnosis not present

## 2018-06-30 DIAGNOSIS — C9001 Multiple myeloma in remission: Secondary | ICD-10-CM | POA: Diagnosis not present

## 2018-06-30 DIAGNOSIS — R7989 Other specified abnormal findings of blood chemistry: Secondary | ICD-10-CM | POA: Diagnosis not present

## 2018-06-30 DIAGNOSIS — D890 Polyclonal hypergammaglobulinemia: Secondary | ICD-10-CM | POA: Diagnosis not present

## 2018-07-09 DIAGNOSIS — I4891 Unspecified atrial fibrillation: Secondary | ICD-10-CM | POA: Diagnosis not present

## 2018-07-09 DIAGNOSIS — M810 Age-related osteoporosis without current pathological fracture: Secondary | ICD-10-CM | POA: Diagnosis not present

## 2018-07-09 DIAGNOSIS — Z79899 Other long term (current) drug therapy: Secondary | ICD-10-CM | POA: Diagnosis not present

## 2018-07-09 DIAGNOSIS — Z8781 Personal history of (healed) traumatic fracture: Secondary | ICD-10-CM | POA: Diagnosis not present

## 2018-07-09 DIAGNOSIS — K219 Gastro-esophageal reflux disease without esophagitis: Secondary | ICD-10-CM | POA: Diagnosis not present

## 2018-07-09 DIAGNOSIS — Z9181 History of falling: Secondary | ICD-10-CM | POA: Diagnosis not present

## 2018-07-12 DIAGNOSIS — R609 Edema, unspecified: Secondary | ICD-10-CM | POA: Diagnosis not present

## 2018-07-14 DIAGNOSIS — Z79899 Other long term (current) drug therapy: Secondary | ICD-10-CM | POA: Diagnosis not present

## 2018-07-14 DIAGNOSIS — I440 Atrioventricular block, first degree: Secondary | ICD-10-CM | POA: Diagnosis not present

## 2018-07-14 DIAGNOSIS — I4510 Unspecified right bundle-branch block: Secondary | ICD-10-CM | POA: Diagnosis not present

## 2018-07-14 DIAGNOSIS — M48061 Spinal stenosis, lumbar region without neurogenic claudication: Secondary | ICD-10-CM | POA: Diagnosis not present

## 2018-07-14 DIAGNOSIS — G629 Polyneuropathy, unspecified: Secondary | ICD-10-CM | POA: Diagnosis not present

## 2018-07-14 DIAGNOSIS — F329 Major depressive disorder, single episode, unspecified: Secondary | ICD-10-CM | POA: Diagnosis not present

## 2018-07-14 DIAGNOSIS — Z96641 Presence of right artificial hip joint: Secondary | ICD-10-CM | POA: Diagnosis not present

## 2018-07-14 DIAGNOSIS — R270 Ataxia, unspecified: Secondary | ICD-10-CM | POA: Diagnosis not present

## 2018-07-14 DIAGNOSIS — Z9989 Dependence on other enabling machines and devices: Secondary | ICD-10-CM | POA: Diagnosis not present

## 2018-07-14 DIAGNOSIS — D472 Monoclonal gammopathy: Secondary | ICD-10-CM | POA: Diagnosis not present

## 2018-07-14 DIAGNOSIS — Z7901 Long term (current) use of anticoagulants: Secondary | ICD-10-CM | POA: Diagnosis not present

## 2018-07-14 DIAGNOSIS — M1990 Unspecified osteoarthritis, unspecified site: Secondary | ICD-10-CM | POA: Diagnosis not present

## 2018-07-14 DIAGNOSIS — J449 Chronic obstructive pulmonary disease, unspecified: Secondary | ICD-10-CM | POA: Diagnosis not present

## 2018-07-14 DIAGNOSIS — I4891 Unspecified atrial fibrillation: Secondary | ICD-10-CM | POA: Diagnosis not present

## 2018-07-14 DIAGNOSIS — Z950 Presence of cardiac pacemaker: Secondary | ICD-10-CM | POA: Diagnosis not present

## 2018-07-14 DIAGNOSIS — R278 Other lack of coordination: Secondary | ICD-10-CM | POA: Diagnosis not present

## 2018-07-17 DIAGNOSIS — K449 Diaphragmatic hernia without obstruction or gangrene: Secondary | ICD-10-CM | POA: Diagnosis not present

## 2018-07-17 DIAGNOSIS — R779 Abnormality of plasma protein, unspecified: Secondary | ICD-10-CM | POA: Diagnosis not present

## 2018-07-17 DIAGNOSIS — C9000 Multiple myeloma not having achieved remission: Secondary | ICD-10-CM | POA: Diagnosis not present

## 2018-07-17 DIAGNOSIS — C9010 Plasma cell leukemia not having achieved remission: Secondary | ICD-10-CM | POA: Diagnosis not present

## 2018-07-17 DIAGNOSIS — R778 Other specified abnormalities of plasma proteins: Secondary | ICD-10-CM | POA: Diagnosis not present

## 2018-07-17 DIAGNOSIS — D890 Polyclonal hypergammaglobulinemia: Secondary | ICD-10-CM | POA: Diagnosis not present

## 2018-07-21 DIAGNOSIS — R778 Other specified abnormalities of plasma proteins: Secondary | ICD-10-CM | POA: Diagnosis not present

## 2018-07-28 DIAGNOSIS — D472 Monoclonal gammopathy: Secondary | ICD-10-CM | POA: Diagnosis not present

## 2018-08-29 DIAGNOSIS — D472 Monoclonal gammopathy: Secondary | ICD-10-CM | POA: Diagnosis not present

## 2018-08-29 DIAGNOSIS — Z452 Encounter for adjustment and management of vascular access device: Secondary | ICD-10-CM | POA: Diagnosis not present

## 2018-09-02 DIAGNOSIS — I4819 Other persistent atrial fibrillation: Secondary | ICD-10-CM | POA: Diagnosis not present

## 2018-09-02 DIAGNOSIS — M545 Low back pain: Secondary | ICD-10-CM | POA: Diagnosis not present

## 2018-09-02 DIAGNOSIS — F331 Major depressive disorder, recurrent, moderate: Secondary | ICD-10-CM | POA: Diagnosis not present

## 2018-09-02 DIAGNOSIS — Z9181 History of falling: Secondary | ICD-10-CM | POA: Diagnosis not present

## 2018-09-02 DIAGNOSIS — F419 Anxiety disorder, unspecified: Secondary | ICD-10-CM | POA: Diagnosis not present

## 2018-09-02 DIAGNOSIS — I10 Essential (primary) hypertension: Secondary | ICD-10-CM | POA: Diagnosis not present

## 2018-09-02 DIAGNOSIS — M810 Age-related osteoporosis without current pathological fracture: Secondary | ICD-10-CM | POA: Diagnosis not present

## 2018-09-02 DIAGNOSIS — H938X3 Other specified disorders of ear, bilateral: Secondary | ICD-10-CM | POA: Diagnosis not present

## 2018-09-02 DIAGNOSIS — D890 Polyclonal hypergammaglobulinemia: Secondary | ICD-10-CM | POA: Diagnosis not present

## 2018-09-02 DIAGNOSIS — Z8673 Personal history of transient ischemic attack (TIA), and cerebral infarction without residual deficits: Secondary | ICD-10-CM | POA: Diagnosis not present

## 2018-09-02 DIAGNOSIS — Z85828 Personal history of other malignant neoplasm of skin: Secondary | ICD-10-CM | POA: Diagnosis not present

## 2018-09-02 DIAGNOSIS — Z7901 Long term (current) use of anticoagulants: Secondary | ICD-10-CM | POA: Diagnosis not present

## 2018-09-02 DIAGNOSIS — Z96649 Presence of unspecified artificial hip joint: Secondary | ICD-10-CM | POA: Diagnosis not present

## 2018-09-02 DIAGNOSIS — Z950 Presence of cardiac pacemaker: Secondary | ICD-10-CM | POA: Diagnosis not present

## 2018-09-02 DIAGNOSIS — F4321 Adjustment disorder with depressed mood: Secondary | ICD-10-CM | POA: Diagnosis not present

## 2018-09-02 DIAGNOSIS — E785 Hyperlipidemia, unspecified: Secondary | ICD-10-CM | POA: Diagnosis not present

## 2018-09-02 DIAGNOSIS — G20 Parkinson's disease: Secondary | ICD-10-CM | POA: Diagnosis not present

## 2018-09-02 DIAGNOSIS — Z87440 Personal history of urinary (tract) infections: Secondary | ICD-10-CM | POA: Diagnosis not present

## 2018-09-02 DIAGNOSIS — K219 Gastro-esophageal reflux disease without esophagitis: Secondary | ICD-10-CM | POA: Diagnosis not present

## 2018-09-04 DIAGNOSIS — G20 Parkinson's disease: Secondary | ICD-10-CM | POA: Diagnosis not present

## 2018-09-04 DIAGNOSIS — F419 Anxiety disorder, unspecified: Secondary | ICD-10-CM | POA: Diagnosis not present

## 2018-09-04 DIAGNOSIS — D890 Polyclonal hypergammaglobulinemia: Secondary | ICD-10-CM | POA: Diagnosis not present

## 2018-09-04 DIAGNOSIS — I4819 Other persistent atrial fibrillation: Secondary | ICD-10-CM | POA: Diagnosis not present

## 2018-09-04 DIAGNOSIS — F331 Major depressive disorder, recurrent, moderate: Secondary | ICD-10-CM | POA: Diagnosis not present

## 2018-09-04 DIAGNOSIS — F4321 Adjustment disorder with depressed mood: Secondary | ICD-10-CM | POA: Diagnosis not present

## 2018-09-08 DIAGNOSIS — F419 Anxiety disorder, unspecified: Secondary | ICD-10-CM | POA: Diagnosis not present

## 2018-09-08 DIAGNOSIS — F4321 Adjustment disorder with depressed mood: Secondary | ICD-10-CM | POA: Diagnosis not present

## 2018-09-08 DIAGNOSIS — G20 Parkinson's disease: Secondary | ICD-10-CM | POA: Diagnosis not present

## 2018-09-08 DIAGNOSIS — D890 Polyclonal hypergammaglobulinemia: Secondary | ICD-10-CM | POA: Diagnosis not present

## 2018-09-08 DIAGNOSIS — F331 Major depressive disorder, recurrent, moderate: Secondary | ICD-10-CM | POA: Diagnosis not present

## 2018-09-08 DIAGNOSIS — I4819 Other persistent atrial fibrillation: Secondary | ICD-10-CM | POA: Diagnosis not present

## 2018-09-10 DIAGNOSIS — G20 Parkinson's disease: Secondary | ICD-10-CM | POA: Diagnosis not present

## 2018-09-10 DIAGNOSIS — D890 Polyclonal hypergammaglobulinemia: Secondary | ICD-10-CM | POA: Diagnosis not present

## 2018-09-10 DIAGNOSIS — F4321 Adjustment disorder with depressed mood: Secondary | ICD-10-CM | POA: Diagnosis not present

## 2018-09-10 DIAGNOSIS — F419 Anxiety disorder, unspecified: Secondary | ICD-10-CM | POA: Diagnosis not present

## 2018-09-10 DIAGNOSIS — I4819 Other persistent atrial fibrillation: Secondary | ICD-10-CM | POA: Diagnosis not present

## 2018-09-10 DIAGNOSIS — F331 Major depressive disorder, recurrent, moderate: Secondary | ICD-10-CM | POA: Diagnosis not present

## 2018-09-15 DIAGNOSIS — D890 Polyclonal hypergammaglobulinemia: Secondary | ICD-10-CM | POA: Diagnosis not present

## 2018-09-15 DIAGNOSIS — I4819 Other persistent atrial fibrillation: Secondary | ICD-10-CM | POA: Diagnosis not present

## 2018-09-15 DIAGNOSIS — F419 Anxiety disorder, unspecified: Secondary | ICD-10-CM | POA: Diagnosis not present

## 2018-09-15 DIAGNOSIS — G20 Parkinson's disease: Secondary | ICD-10-CM | POA: Diagnosis not present

## 2018-09-15 DIAGNOSIS — F331 Major depressive disorder, recurrent, moderate: Secondary | ICD-10-CM | POA: Diagnosis not present

## 2018-09-15 DIAGNOSIS — F4321 Adjustment disorder with depressed mood: Secondary | ICD-10-CM | POA: Diagnosis not present

## 2018-09-17 DIAGNOSIS — D890 Polyclonal hypergammaglobulinemia: Secondary | ICD-10-CM | POA: Diagnosis not present

## 2018-09-17 DIAGNOSIS — F4321 Adjustment disorder with depressed mood: Secondary | ICD-10-CM | POA: Diagnosis not present

## 2018-09-17 DIAGNOSIS — F331 Major depressive disorder, recurrent, moderate: Secondary | ICD-10-CM | POA: Diagnosis not present

## 2018-09-17 DIAGNOSIS — I4819 Other persistent atrial fibrillation: Secondary | ICD-10-CM | POA: Diagnosis not present

## 2018-09-17 DIAGNOSIS — G20 Parkinson's disease: Secondary | ICD-10-CM | POA: Diagnosis not present

## 2018-09-17 DIAGNOSIS — F419 Anxiety disorder, unspecified: Secondary | ICD-10-CM | POA: Diagnosis not present

## 2018-09-22 DIAGNOSIS — I4819 Other persistent atrial fibrillation: Secondary | ICD-10-CM | POA: Diagnosis not present

## 2018-09-22 DIAGNOSIS — F419 Anxiety disorder, unspecified: Secondary | ICD-10-CM | POA: Diagnosis not present

## 2018-09-22 DIAGNOSIS — F331 Major depressive disorder, recurrent, moderate: Secondary | ICD-10-CM | POA: Diagnosis not present

## 2018-09-22 DIAGNOSIS — D890 Polyclonal hypergammaglobulinemia: Secondary | ICD-10-CM | POA: Diagnosis not present

## 2018-09-22 DIAGNOSIS — F4321 Adjustment disorder with depressed mood: Secondary | ICD-10-CM | POA: Diagnosis not present

## 2018-09-22 DIAGNOSIS — G20 Parkinson's disease: Secondary | ICD-10-CM | POA: Diagnosis not present

## 2018-09-24 DIAGNOSIS — D890 Polyclonal hypergammaglobulinemia: Secondary | ICD-10-CM | POA: Diagnosis not present

## 2018-09-24 DIAGNOSIS — F4321 Adjustment disorder with depressed mood: Secondary | ICD-10-CM | POA: Diagnosis not present

## 2018-09-24 DIAGNOSIS — G20 Parkinson's disease: Secondary | ICD-10-CM | POA: Diagnosis not present

## 2018-09-24 DIAGNOSIS — F331 Major depressive disorder, recurrent, moderate: Secondary | ICD-10-CM | POA: Diagnosis not present

## 2018-09-24 DIAGNOSIS — F419 Anxiety disorder, unspecified: Secondary | ICD-10-CM | POA: Diagnosis not present

## 2018-09-24 DIAGNOSIS — I4819 Other persistent atrial fibrillation: Secondary | ICD-10-CM | POA: Diagnosis not present

## 2018-09-29 DIAGNOSIS — G20 Parkinson's disease: Secondary | ICD-10-CM | POA: Diagnosis not present

## 2018-09-29 DIAGNOSIS — D890 Polyclonal hypergammaglobulinemia: Secondary | ICD-10-CM | POA: Diagnosis not present

## 2018-09-29 DIAGNOSIS — F419 Anxiety disorder, unspecified: Secondary | ICD-10-CM | POA: Diagnosis not present

## 2018-09-29 DIAGNOSIS — F331 Major depressive disorder, recurrent, moderate: Secondary | ICD-10-CM | POA: Diagnosis not present

## 2018-09-29 DIAGNOSIS — F4321 Adjustment disorder with depressed mood: Secondary | ICD-10-CM | POA: Diagnosis not present

## 2018-09-29 DIAGNOSIS — I4819 Other persistent atrial fibrillation: Secondary | ICD-10-CM | POA: Diagnosis not present

## 2018-10-01 DIAGNOSIS — R197 Diarrhea, unspecified: Secondary | ICD-10-CM | POA: Diagnosis not present

## 2018-10-01 DIAGNOSIS — D890 Polyclonal hypergammaglobulinemia: Secondary | ICD-10-CM | POA: Diagnosis not present

## 2018-10-01 DIAGNOSIS — F331 Major depressive disorder, recurrent, moderate: Secondary | ICD-10-CM | POA: Diagnosis not present

## 2018-10-01 DIAGNOSIS — F419 Anxiety disorder, unspecified: Secondary | ICD-10-CM | POA: Diagnosis not present

## 2018-10-01 DIAGNOSIS — J449 Chronic obstructive pulmonary disease, unspecified: Secondary | ICD-10-CM | POA: Diagnosis not present

## 2018-10-01 DIAGNOSIS — G20 Parkinson's disease: Secondary | ICD-10-CM | POA: Diagnosis not present

## 2018-10-01 DIAGNOSIS — F4321 Adjustment disorder with depressed mood: Secondary | ICD-10-CM | POA: Diagnosis not present

## 2018-10-01 DIAGNOSIS — I4819 Other persistent atrial fibrillation: Secondary | ICD-10-CM | POA: Diagnosis not present

## 2018-10-02 DIAGNOSIS — I10 Essential (primary) hypertension: Secondary | ICD-10-CM | POA: Diagnosis not present

## 2018-10-02 DIAGNOSIS — D890 Polyclonal hypergammaglobulinemia: Secondary | ICD-10-CM | POA: Diagnosis not present

## 2018-10-02 DIAGNOSIS — M810 Age-related osteoporosis without current pathological fracture: Secondary | ICD-10-CM | POA: Diagnosis not present

## 2018-10-02 DIAGNOSIS — I4819 Other persistent atrial fibrillation: Secondary | ICD-10-CM | POA: Diagnosis not present

## 2018-10-02 DIAGNOSIS — Z9181 History of falling: Secondary | ICD-10-CM | POA: Diagnosis not present

## 2018-10-02 DIAGNOSIS — Z85828 Personal history of other malignant neoplasm of skin: Secondary | ICD-10-CM | POA: Diagnosis not present

## 2018-10-02 DIAGNOSIS — Z950 Presence of cardiac pacemaker: Secondary | ICD-10-CM | POA: Diagnosis not present

## 2018-10-02 DIAGNOSIS — Z7901 Long term (current) use of anticoagulants: Secondary | ICD-10-CM | POA: Diagnosis not present

## 2018-10-02 DIAGNOSIS — H938X3 Other specified disorders of ear, bilateral: Secondary | ICD-10-CM | POA: Diagnosis not present

## 2018-10-02 DIAGNOSIS — F331 Major depressive disorder, recurrent, moderate: Secondary | ICD-10-CM | POA: Diagnosis not present

## 2018-10-02 DIAGNOSIS — G20 Parkinson's disease: Secondary | ICD-10-CM | POA: Diagnosis not present

## 2018-10-02 DIAGNOSIS — K219 Gastro-esophageal reflux disease without esophagitis: Secondary | ICD-10-CM | POA: Diagnosis not present

## 2018-10-02 DIAGNOSIS — Z96649 Presence of unspecified artificial hip joint: Secondary | ICD-10-CM | POA: Diagnosis not present

## 2018-10-02 DIAGNOSIS — E785 Hyperlipidemia, unspecified: Secondary | ICD-10-CM | POA: Diagnosis not present

## 2018-10-02 DIAGNOSIS — F419 Anxiety disorder, unspecified: Secondary | ICD-10-CM | POA: Diagnosis not present

## 2018-10-02 DIAGNOSIS — F4321 Adjustment disorder with depressed mood: Secondary | ICD-10-CM | POA: Diagnosis not present

## 2018-10-02 DIAGNOSIS — Z87440 Personal history of urinary (tract) infections: Secondary | ICD-10-CM | POA: Diagnosis not present

## 2018-10-02 DIAGNOSIS — Z8673 Personal history of transient ischemic attack (TIA), and cerebral infarction without residual deficits: Secondary | ICD-10-CM | POA: Diagnosis not present

## 2018-10-02 DIAGNOSIS — M545 Low back pain: Secondary | ICD-10-CM | POA: Diagnosis not present

## 2018-10-06 DIAGNOSIS — I4819 Other persistent atrial fibrillation: Secondary | ICD-10-CM | POA: Diagnosis not present

## 2018-10-06 DIAGNOSIS — F4321 Adjustment disorder with depressed mood: Secondary | ICD-10-CM | POA: Diagnosis not present

## 2018-10-06 DIAGNOSIS — D890 Polyclonal hypergammaglobulinemia: Secondary | ICD-10-CM | POA: Diagnosis not present

## 2018-10-06 DIAGNOSIS — F331 Major depressive disorder, recurrent, moderate: Secondary | ICD-10-CM | POA: Diagnosis not present

## 2018-10-06 DIAGNOSIS — G20 Parkinson's disease: Secondary | ICD-10-CM | POA: Diagnosis not present

## 2018-10-06 DIAGNOSIS — F419 Anxiety disorder, unspecified: Secondary | ICD-10-CM | POA: Diagnosis not present

## 2018-10-14 DIAGNOSIS — I4819 Other persistent atrial fibrillation: Secondary | ICD-10-CM | POA: Diagnosis not present

## 2018-10-14 DIAGNOSIS — G20 Parkinson's disease: Secondary | ICD-10-CM | POA: Diagnosis not present

## 2018-10-14 DIAGNOSIS — F331 Major depressive disorder, recurrent, moderate: Secondary | ICD-10-CM | POA: Diagnosis not present

## 2018-10-14 DIAGNOSIS — D890 Polyclonal hypergammaglobulinemia: Secondary | ICD-10-CM | POA: Diagnosis not present

## 2018-10-14 DIAGNOSIS — F419 Anxiety disorder, unspecified: Secondary | ICD-10-CM | POA: Diagnosis not present

## 2018-10-14 DIAGNOSIS — F4321 Adjustment disorder with depressed mood: Secondary | ICD-10-CM | POA: Diagnosis not present

## 2018-10-22 DIAGNOSIS — F419 Anxiety disorder, unspecified: Secondary | ICD-10-CM | POA: Diagnosis not present

## 2018-10-22 DIAGNOSIS — G20 Parkinson's disease: Secondary | ICD-10-CM | POA: Diagnosis not present

## 2018-10-22 DIAGNOSIS — D890 Polyclonal hypergammaglobulinemia: Secondary | ICD-10-CM | POA: Diagnosis not present

## 2018-10-22 DIAGNOSIS — F4321 Adjustment disorder with depressed mood: Secondary | ICD-10-CM | POA: Diagnosis not present

## 2018-10-22 DIAGNOSIS — I4819 Other persistent atrial fibrillation: Secondary | ICD-10-CM | POA: Diagnosis not present

## 2018-10-22 DIAGNOSIS — F331 Major depressive disorder, recurrent, moderate: Secondary | ICD-10-CM | POA: Diagnosis not present

## 2018-10-24 DIAGNOSIS — R278 Other lack of coordination: Secondary | ICD-10-CM | POA: Diagnosis not present

## 2018-10-24 DIAGNOSIS — Z7409 Other reduced mobility: Secondary | ICD-10-CM | POA: Diagnosis not present

## 2018-10-24 DIAGNOSIS — M6281 Muscle weakness (generalized): Secondary | ICD-10-CM | POA: Diagnosis not present

## 2018-10-24 DIAGNOSIS — M545 Low back pain: Secondary | ICD-10-CM | POA: Diagnosis not present

## 2018-10-28 DIAGNOSIS — I110 Hypertensive heart disease with heart failure: Secondary | ICD-10-CM | POA: Diagnosis not present

## 2018-10-28 DIAGNOSIS — I5032 Chronic diastolic (congestive) heart failure: Secondary | ICD-10-CM | POA: Diagnosis not present

## 2018-10-28 DIAGNOSIS — Z79899 Other long term (current) drug therapy: Secondary | ICD-10-CM | POA: Diagnosis not present

## 2018-10-28 DIAGNOSIS — R001 Bradycardia, unspecified: Secondary | ICD-10-CM | POA: Diagnosis not present

## 2018-10-28 DIAGNOSIS — Z950 Presence of cardiac pacemaker: Secondary | ICD-10-CM | POA: Diagnosis not present

## 2018-10-28 DIAGNOSIS — I480 Paroxysmal atrial fibrillation: Secondary | ICD-10-CM | POA: Diagnosis not present

## 2018-10-28 DIAGNOSIS — Z7901 Long term (current) use of anticoagulants: Secondary | ICD-10-CM | POA: Diagnosis not present

## 2018-10-28 DIAGNOSIS — I2510 Atherosclerotic heart disease of native coronary artery without angina pectoris: Secondary | ICD-10-CM | POA: Diagnosis not present

## 2018-10-28 DIAGNOSIS — Z7951 Long term (current) use of inhaled steroids: Secondary | ICD-10-CM | POA: Diagnosis not present

## 2018-10-28 DIAGNOSIS — R0609 Other forms of dyspnea: Secondary | ICD-10-CM | POA: Diagnosis not present

## 2018-10-29 DIAGNOSIS — H43822 Vitreomacular adhesion, left eye: Secondary | ICD-10-CM | POA: Diagnosis not present

## 2018-10-29 DIAGNOSIS — Z961 Presence of intraocular lens: Secondary | ICD-10-CM | POA: Diagnosis not present

## 2018-10-29 DIAGNOSIS — H524 Presbyopia: Secondary | ICD-10-CM | POA: Diagnosis not present

## 2018-10-30 DIAGNOSIS — R159 Full incontinence of feces: Secondary | ICD-10-CM | POA: Diagnosis not present

## 2018-10-30 DIAGNOSIS — K529 Noninfective gastroenteritis and colitis, unspecified: Secondary | ICD-10-CM | POA: Diagnosis not present

## 2018-11-24 DIAGNOSIS — I480 Paroxysmal atrial fibrillation: Secondary | ICD-10-CM | POA: Diagnosis not present

## 2018-11-24 DIAGNOSIS — I5032 Chronic diastolic (congestive) heart failure: Secondary | ICD-10-CM | POA: Diagnosis not present

## 2018-12-01 DIAGNOSIS — D509 Iron deficiency anemia, unspecified: Secondary | ICD-10-CM | POA: Diagnosis not present

## 2018-12-01 DIAGNOSIS — D472 Monoclonal gammopathy: Secondary | ICD-10-CM | POA: Diagnosis not present

## 2018-12-01 DIAGNOSIS — C9000 Multiple myeloma not having achieved remission: Secondary | ICD-10-CM | POA: Diagnosis not present

## 2018-12-01 DIAGNOSIS — M810 Age-related osteoporosis without current pathological fracture: Secondary | ICD-10-CM | POA: Diagnosis not present

## 2018-12-01 DIAGNOSIS — M8580 Other specified disorders of bone density and structure, unspecified site: Secondary | ICD-10-CM | POA: Diagnosis not present

## 2018-12-09 DIAGNOSIS — R002 Palpitations: Secondary | ICD-10-CM | POA: Diagnosis not present

## 2018-12-09 DIAGNOSIS — R2689 Other abnormalities of gait and mobility: Secondary | ICD-10-CM | POA: Diagnosis not present

## 2018-12-09 DIAGNOSIS — F419 Anxiety disorder, unspecified: Secondary | ICD-10-CM | POA: Diagnosis not present

## 2018-12-09 DIAGNOSIS — J449 Chronic obstructive pulmonary disease, unspecified: Secondary | ICD-10-CM | POA: Diagnosis not present

## 2018-12-09 DIAGNOSIS — R6883 Chills (without fever): Secondary | ICD-10-CM | POA: Diagnosis not present

## 2018-12-09 DIAGNOSIS — I5032 Chronic diastolic (congestive) heart failure: Secondary | ICD-10-CM | POA: Diagnosis not present

## 2018-12-09 DIAGNOSIS — I4510 Unspecified right bundle-branch block: Secondary | ICD-10-CM | POA: Diagnosis not present

## 2018-12-09 DIAGNOSIS — R42 Dizziness and giddiness: Secondary | ICD-10-CM | POA: Diagnosis not present

## 2018-12-09 DIAGNOSIS — Z950 Presence of cardiac pacemaker: Secondary | ICD-10-CM | POA: Diagnosis not present

## 2018-12-09 DIAGNOSIS — Z1283 Encounter for screening for malignant neoplasm of skin: Secondary | ICD-10-CM | POA: Diagnosis not present

## 2018-12-09 DIAGNOSIS — I480 Paroxysmal atrial fibrillation: Secondary | ICD-10-CM | POA: Diagnosis not present

## 2018-12-09 DIAGNOSIS — I440 Atrioventricular block, first degree: Secondary | ICD-10-CM | POA: Diagnosis not present

## 2018-12-11 DIAGNOSIS — Z9049 Acquired absence of other specified parts of digestive tract: Secondary | ICD-10-CM | POA: Diagnosis not present

## 2018-12-11 DIAGNOSIS — K529 Noninfective gastroenteritis and colitis, unspecified: Secondary | ICD-10-CM | POA: Diagnosis not present

## 2018-12-11 DIAGNOSIS — R159 Full incontinence of feces: Secondary | ICD-10-CM | POA: Diagnosis not present

## 2018-12-16 DIAGNOSIS — I480 Paroxysmal atrial fibrillation: Secondary | ICD-10-CM | POA: Diagnosis not present

## 2018-12-16 DIAGNOSIS — Z79899 Other long term (current) drug therapy: Secondary | ICD-10-CM | POA: Diagnosis not present

## 2018-12-16 DIAGNOSIS — I2510 Atherosclerotic heart disease of native coronary artery without angina pectoris: Secondary | ICD-10-CM | POA: Diagnosis not present

## 2018-12-16 DIAGNOSIS — R0609 Other forms of dyspnea: Secondary | ICD-10-CM | POA: Diagnosis not present

## 2018-12-16 DIAGNOSIS — I5030 Unspecified diastolic (congestive) heart failure: Secondary | ICD-10-CM | POA: Diagnosis not present

## 2018-12-16 DIAGNOSIS — R0600 Dyspnea, unspecified: Secondary | ICD-10-CM | POA: Diagnosis not present

## 2018-12-16 DIAGNOSIS — Z7901 Long term (current) use of anticoagulants: Secondary | ICD-10-CM | POA: Diagnosis not present

## 2018-12-16 DIAGNOSIS — I110 Hypertensive heart disease with heart failure: Secondary | ICD-10-CM | POA: Diagnosis not present

## 2018-12-31 DIAGNOSIS — I10 Essential (primary) hypertension: Secondary | ICD-10-CM | POA: Diagnosis not present

## 2018-12-31 DIAGNOSIS — I2510 Atherosclerotic heart disease of native coronary artery without angina pectoris: Secondary | ICD-10-CM | POA: Diagnosis not present

## 2018-12-31 DIAGNOSIS — R0600 Dyspnea, unspecified: Secondary | ICD-10-CM | POA: Diagnosis not present

## 2019-01-05 DIAGNOSIS — D472 Monoclonal gammopathy: Secondary | ICD-10-CM | POA: Diagnosis not present

## 2019-01-05 DIAGNOSIS — Z452 Encounter for adjustment and management of vascular access device: Secondary | ICD-10-CM | POA: Diagnosis not present

## 2019-01-06 DIAGNOSIS — R300 Dysuria: Secondary | ICD-10-CM | POA: Diagnosis not present

## 2019-01-29 DIAGNOSIS — Z23 Encounter for immunization: Secondary | ICD-10-CM | POA: Diagnosis not present

## 2019-02-02 DIAGNOSIS — K449 Diaphragmatic hernia without obstruction or gangrene: Secondary | ICD-10-CM | POA: Diagnosis not present

## 2019-02-02 DIAGNOSIS — Z043 Encounter for examination and observation following other accident: Secondary | ICD-10-CM | POA: Diagnosis not present

## 2019-02-02 DIAGNOSIS — W1839XA Other fall on same level, initial encounter: Secondary | ICD-10-CM | POA: Diagnosis not present

## 2019-02-02 DIAGNOSIS — Z5320 Procedure and treatment not carried out because of patient's decision for unspecified reasons: Secondary | ICD-10-CM | POA: Diagnosis not present

## 2019-02-02 DIAGNOSIS — Y998 Other external cause status: Secondary | ICD-10-CM | POA: Diagnosis not present

## 2019-02-02 DIAGNOSIS — M5136 Other intervertebral disc degeneration, lumbar region: Secondary | ICD-10-CM | POA: Diagnosis not present

## 2019-02-02 DIAGNOSIS — Z9889 Other specified postprocedural states: Secondary | ICD-10-CM | POA: Diagnosis not present

## 2019-02-02 DIAGNOSIS — Z959 Presence of cardiac and vascular implant and graft, unspecified: Secondary | ICD-10-CM | POA: Diagnosis not present

## 2019-02-02 DIAGNOSIS — M419 Scoliosis, unspecified: Secondary | ICD-10-CM | POA: Diagnosis not present

## 2019-02-02 DIAGNOSIS — M545 Low back pain: Secondary | ICD-10-CM | POA: Diagnosis not present

## 2019-02-02 DIAGNOSIS — M8588 Other specified disorders of bone density and structure, other site: Secondary | ICD-10-CM | POA: Diagnosis not present

## 2019-02-11 DIAGNOSIS — M6281 Muscle weakness (generalized): Secondary | ICD-10-CM | POA: Diagnosis not present

## 2019-02-11 DIAGNOSIS — R296 Repeated falls: Secondary | ICD-10-CM | POA: Diagnosis not present

## 2019-02-11 DIAGNOSIS — G20 Parkinson's disease: Secondary | ICD-10-CM | POA: Diagnosis not present

## 2019-02-13 DIAGNOSIS — F332 Major depressive disorder, recurrent severe without psychotic features: Secondary | ICD-10-CM | POA: Diagnosis not present

## 2019-02-13 DIAGNOSIS — G20 Parkinson's disease: Secondary | ICD-10-CM | POA: Diagnosis not present

## 2019-02-13 DIAGNOSIS — R350 Frequency of micturition: Secondary | ICD-10-CM | POA: Diagnosis not present

## 2019-02-13 DIAGNOSIS — G4700 Insomnia, unspecified: Secondary | ICD-10-CM | POA: Diagnosis not present

## 2019-02-13 DIAGNOSIS — R296 Repeated falls: Secondary | ICD-10-CM | POA: Diagnosis not present

## 2019-02-13 DIAGNOSIS — G629 Polyneuropathy, unspecified: Secondary | ICD-10-CM | POA: Diagnosis not present

## 2019-02-13 DIAGNOSIS — R300 Dysuria: Secondary | ICD-10-CM | POA: Diagnosis not present

## 2019-02-13 DIAGNOSIS — M6281 Muscle weakness (generalized): Secondary | ICD-10-CM | POA: Diagnosis not present

## 2019-02-16 DIAGNOSIS — G20 Parkinson's disease: Secondary | ICD-10-CM | POA: Diagnosis not present

## 2019-02-16 DIAGNOSIS — R61 Generalized hyperhidrosis: Secondary | ICD-10-CM | POA: Diagnosis not present

## 2019-02-16 DIAGNOSIS — R232 Flushing: Secondary | ICD-10-CM | POA: Diagnosis not present

## 2019-02-16 DIAGNOSIS — N951 Menopausal and female climacteric states: Secondary | ICD-10-CM | POA: Diagnosis not present

## 2019-02-16 DIAGNOSIS — F329 Major depressive disorder, single episode, unspecified: Secondary | ICD-10-CM | POA: Diagnosis not present

## 2019-02-16 DIAGNOSIS — M6281 Muscle weakness (generalized): Secondary | ICD-10-CM | POA: Diagnosis not present

## 2019-02-16 DIAGNOSIS — Z79899 Other long term (current) drug therapy: Secondary | ICD-10-CM | POA: Diagnosis not present

## 2019-02-16 DIAGNOSIS — R296 Repeated falls: Secondary | ICD-10-CM | POA: Diagnosis not present

## 2019-02-18 DIAGNOSIS — R296 Repeated falls: Secondary | ICD-10-CM | POA: Diagnosis not present

## 2019-02-18 DIAGNOSIS — M6281 Muscle weakness (generalized): Secondary | ICD-10-CM | POA: Diagnosis not present

## 2019-02-18 DIAGNOSIS — G20 Parkinson's disease: Secondary | ICD-10-CM | POA: Diagnosis not present

## 2019-02-23 DIAGNOSIS — M6281 Muscle weakness (generalized): Secondary | ICD-10-CM | POA: Diagnosis not present

## 2019-02-23 DIAGNOSIS — G20 Parkinson's disease: Secondary | ICD-10-CM | POA: Diagnosis not present

## 2019-02-23 DIAGNOSIS — R296 Repeated falls: Secondary | ICD-10-CM | POA: Diagnosis not present

## 2019-02-26 DIAGNOSIS — M6281 Muscle weakness (generalized): Secondary | ICD-10-CM | POA: Diagnosis not present

## 2019-02-26 DIAGNOSIS — R296 Repeated falls: Secondary | ICD-10-CM | POA: Diagnosis not present

## 2019-02-26 DIAGNOSIS — G20 Parkinson's disease: Secondary | ICD-10-CM | POA: Diagnosis not present

## 2019-03-02 DIAGNOSIS — R296 Repeated falls: Secondary | ICD-10-CM | POA: Diagnosis not present

## 2019-03-02 DIAGNOSIS — M6281 Muscle weakness (generalized): Secondary | ICD-10-CM | POA: Diagnosis not present

## 2019-03-02 DIAGNOSIS — G20 Parkinson's disease: Secondary | ICD-10-CM | POA: Diagnosis not present

## 2019-03-05 DIAGNOSIS — D472 Monoclonal gammopathy: Secondary | ICD-10-CM | POA: Diagnosis not present

## 2019-03-05 DIAGNOSIS — R0600 Dyspnea, unspecified: Secondary | ICD-10-CM | POA: Diagnosis not present

## 2019-03-05 DIAGNOSIS — J449 Chronic obstructive pulmonary disease, unspecified: Secondary | ICD-10-CM | POA: Diagnosis not present

## 2019-03-05 DIAGNOSIS — Z7722 Contact with and (suspected) exposure to environmental tobacco smoke (acute) (chronic): Secondary | ICD-10-CM | POA: Diagnosis not present

## 2019-03-05 DIAGNOSIS — K449 Diaphragmatic hernia without obstruction or gangrene: Secondary | ICD-10-CM | POA: Diagnosis not present

## 2019-03-09 DIAGNOSIS — R296 Repeated falls: Secondary | ICD-10-CM | POA: Diagnosis not present

## 2019-03-09 DIAGNOSIS — G20 Parkinson's disease: Secondary | ICD-10-CM | POA: Diagnosis not present

## 2019-03-09 DIAGNOSIS — M6281 Muscle weakness (generalized): Secondary | ICD-10-CM | POA: Diagnosis not present

## 2019-03-10 DIAGNOSIS — Z01812 Encounter for preprocedural laboratory examination: Secondary | ICD-10-CM | POA: Diagnosis not present

## 2019-03-10 DIAGNOSIS — R0600 Dyspnea, unspecified: Secondary | ICD-10-CM | POA: Diagnosis not present

## 2019-03-10 DIAGNOSIS — Z20828 Contact with and (suspected) exposure to other viral communicable diseases: Secondary | ICD-10-CM | POA: Diagnosis not present

## 2019-03-11 DIAGNOSIS — R296 Repeated falls: Secondary | ICD-10-CM | POA: Diagnosis not present

## 2019-03-11 DIAGNOSIS — M6281 Muscle weakness (generalized): Secondary | ICD-10-CM | POA: Diagnosis not present

## 2019-03-11 DIAGNOSIS — G20 Parkinson's disease: Secondary | ICD-10-CM | POA: Diagnosis not present

## 2019-03-16 DIAGNOSIS — R296 Repeated falls: Secondary | ICD-10-CM | POA: Diagnosis not present

## 2019-03-16 DIAGNOSIS — G20 Parkinson's disease: Secondary | ICD-10-CM | POA: Diagnosis not present

## 2019-03-16 DIAGNOSIS — M6281 Muscle weakness (generalized): Secondary | ICD-10-CM | POA: Diagnosis not present

## 2019-03-17 DIAGNOSIS — R0600 Dyspnea, unspecified: Secondary | ICD-10-CM | POA: Diagnosis not present

## 2019-03-17 DIAGNOSIS — J449 Chronic obstructive pulmonary disease, unspecified: Secondary | ICD-10-CM | POA: Diagnosis not present

## 2019-03-19 DIAGNOSIS — M6281 Muscle weakness (generalized): Secondary | ICD-10-CM | POA: Diagnosis not present

## 2019-03-19 DIAGNOSIS — R296 Repeated falls: Secondary | ICD-10-CM | POA: Diagnosis not present

## 2019-03-19 DIAGNOSIS — G20 Parkinson's disease: Secondary | ICD-10-CM | POA: Diagnosis not present

## 2019-04-07 DIAGNOSIS — D539 Nutritional anemia, unspecified: Secondary | ICD-10-CM | POA: Diagnosis not present

## 2019-04-07 DIAGNOSIS — M810 Age-related osteoporosis without current pathological fracture: Secondary | ICD-10-CM | POA: Diagnosis not present

## 2019-04-07 DIAGNOSIS — I480 Paroxysmal atrial fibrillation: Secondary | ICD-10-CM | POA: Diagnosis not present

## 2019-04-07 DIAGNOSIS — I5032 Chronic diastolic (congestive) heart failure: Secondary | ICD-10-CM | POA: Diagnosis not present

## 2019-04-07 DIAGNOSIS — I110 Hypertensive heart disease with heart failure: Secondary | ICD-10-CM | POA: Diagnosis not present

## 2019-04-07 DIAGNOSIS — I2510 Atherosclerotic heart disease of native coronary artery without angina pectoris: Secondary | ICD-10-CM | POA: Diagnosis not present

## 2019-04-07 DIAGNOSIS — Z950 Presence of cardiac pacemaker: Secondary | ICD-10-CM | POA: Diagnosis not present

## 2019-04-07 DIAGNOSIS — G629 Polyneuropathy, unspecified: Secondary | ICD-10-CM | POA: Diagnosis not present

## 2019-04-07 DIAGNOSIS — D472 Monoclonal gammopathy: Secondary | ICD-10-CM | POA: Diagnosis not present

## 2019-04-07 DIAGNOSIS — K449 Diaphragmatic hernia without obstruction or gangrene: Secondary | ICD-10-CM | POA: Diagnosis not present

## 2019-10-16 IMAGING — CT CT CERVICAL SPINE W/O CM
4 of 7 series · 13 of 33 positions shown, 14 images · non-contrast
Comparison: 07/23/2016

CLINICAL DATA: Patient found unresponsive.  Recurrent syncope.

EXAM:
CT HEAD WITHOUT CONTRAST
CT CERVICAL SPINE WITHOUT CONTRAST
TECHNIQUE: Multidetector CT imaging of the head and cervical spine was
performed following the standard protocol without intravenous
contrast. Multiplanar CT image reconstructions of the cervical spine
were also generated.

[Series 4: head bone · axial · 0.41mm/px · z∈[+427,+529]mm · 4 of 87 slices shown]
[im 18/87  bone]
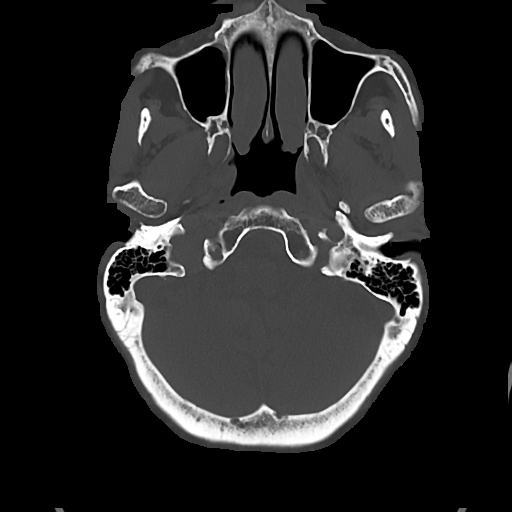
[im 35/87  bone]
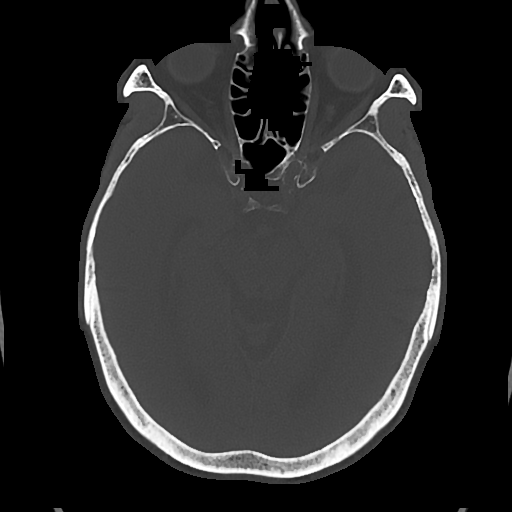
[im 52/87  bone]
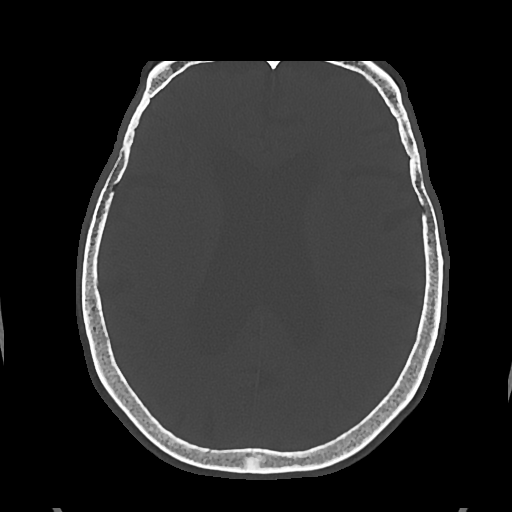
[im 69/87  bone]
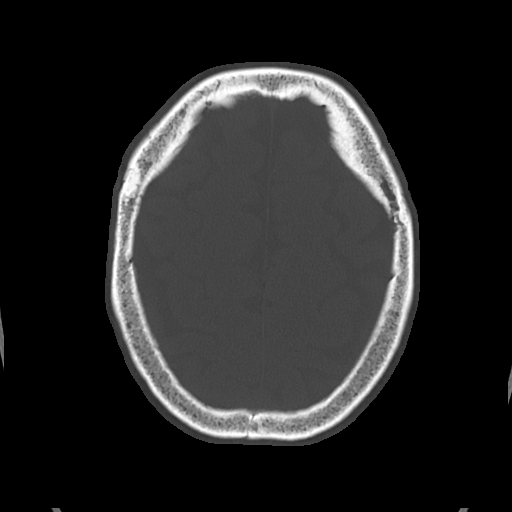

[Series 9: c spine soft · axial · 0.30mm/px · z∈[+300,+404]mm · 4 of 84 slices shown, 5 images]
[im 17/84  soft-tissue]
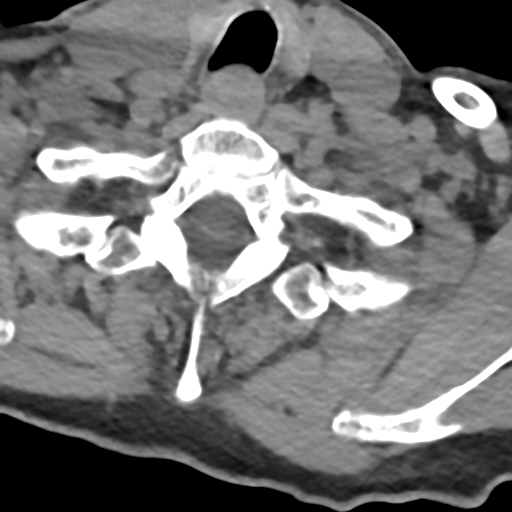
[im 17/84  bone]
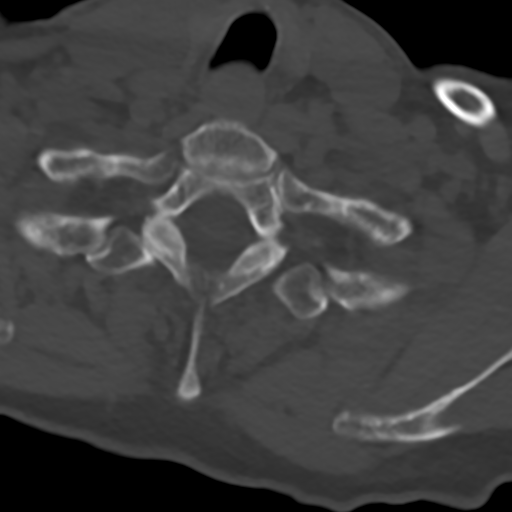
[im 34/84  bone]
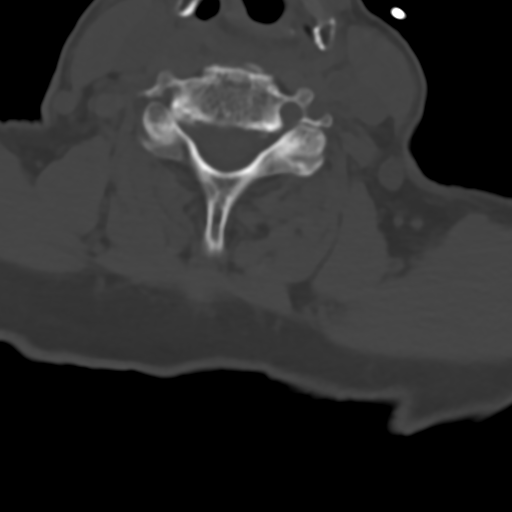
[im 50/84  bone]
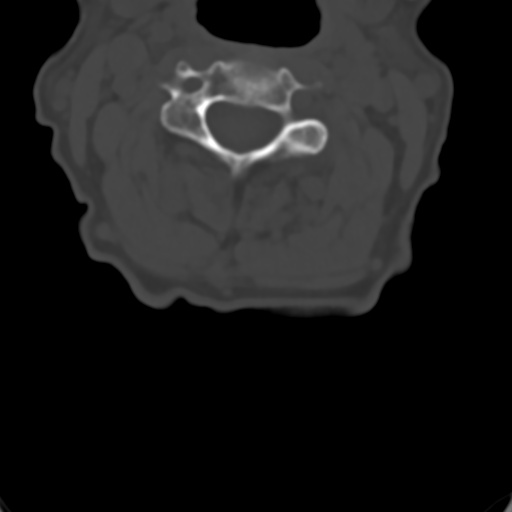
[im 67/84  bone]
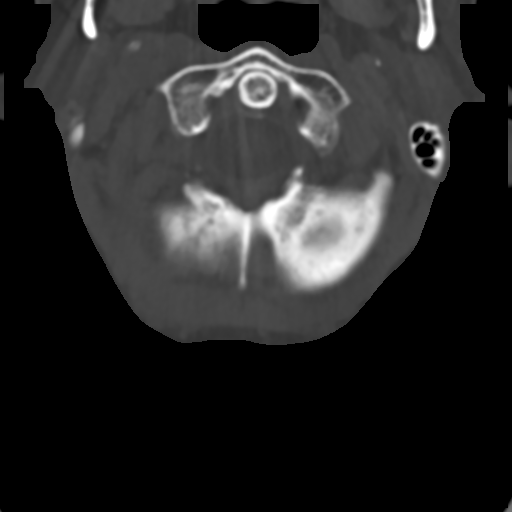

[Series 10: sag bone · sagittal · 0.25mm/px · 4 of 54 slices shown]
[im 11/54  bone]
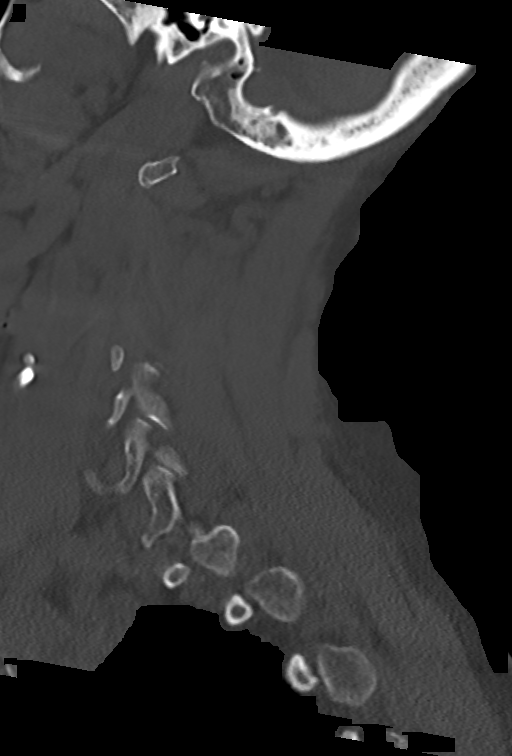
[im 22/54  bone]
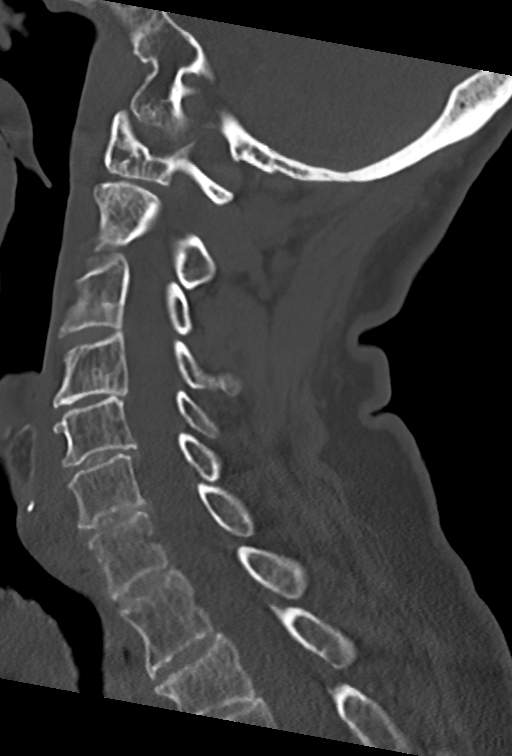
[im 32/54  bone]
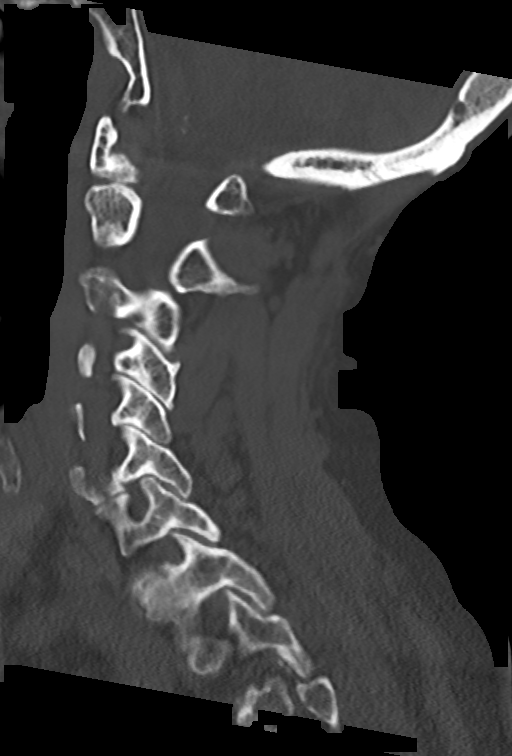
[im 43/54  bone]
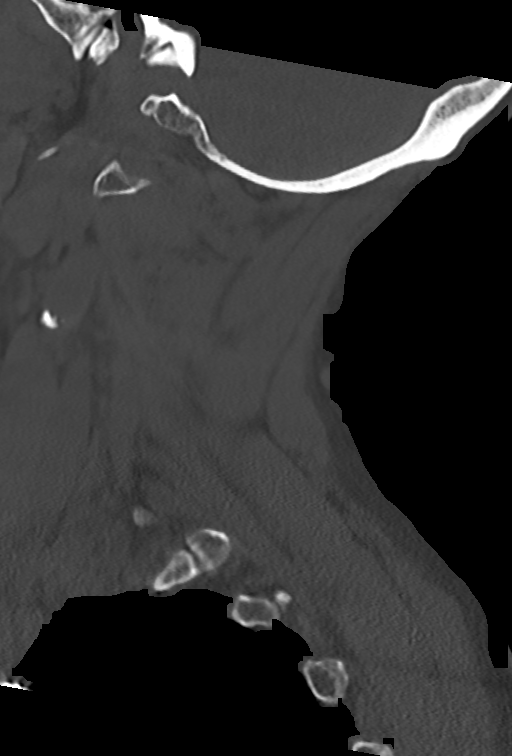

[Series 11: cor bone · coronal · 0.20mm/px · 1 of 61 slices shown]
[im 31/61  bone]
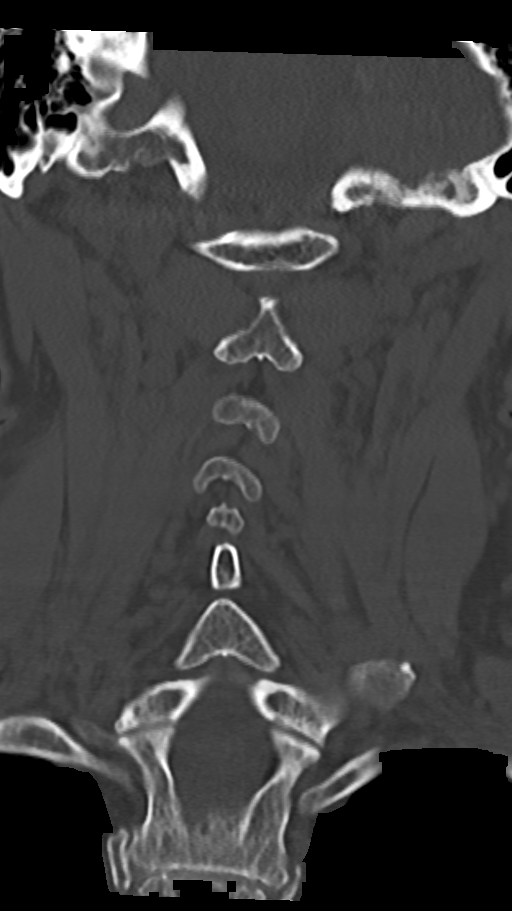

[13 of 33 positions shown; findings below may reference images not displayed]

FINDINGS: CT HEAD FINDINGS

Brain: Stable superficial and central atrophy. Chronic stable
mild-to-moderate small vessel ischemic disease of periventricular
white matter. No acute intracranial hemorrhage, mass, midline shift
nor extra-axial fluid collections. No large vascular territory
infarct.

Vascular: No hyperdense vessel or unexpected calcification.

Skull: No acute skull fracture.

Sinuses/Orbits: No acute finding.  Bilateral lens replacement.

Other: None

CT CERVICAL SPINE FINDINGS

Alignment: Normal.

Skull base and vertebrae: No evidence of acute fracture of the
cervical spine. Stable mild anterior wedge deformity of the T2
vertebral body with minimal retropulsion. No suspicious osseous
lesions

Soft tissues and spinal canal: No prevertebral fluid or swelling. No
visible canal hematoma.

Disc levels: Moderate to marked disc space narrowing C4-5 and C5-6
with slight grade retrolisthesis of C4 on C5. Mild facet
arthropathy. Uncinate spurring bilaterally at C4-5, C5-6 and C6-7.

Upper chest: No acute abnormality. Right jugular Port-A-Cath is
partially imaged.

Other: None
IMPRESSION: 1. Atrophy with chronic small vessel ischemic disease. No acute
intracranial abnormality.
2. No acute cervical spine fracture. Degenerative disc disease C4-5
and C5-6 with degenerative uncinate spurring C4 through C7.
3. Chronic mild T2 compression with minimal retropulsion as before.
